# Patient Record
Sex: Female | Born: 1975
Health system: Southern US, Community
[De-identification: ages and names within clinical notes are randomized; demographics above are authoritative.]

## PROBLEM LIST (undated history)

## (undated) DIAGNOSIS — Z9889 Other specified postprocedural states: Secondary | ICD-10-CM

## (undated) DIAGNOSIS — R112 Nausea with vomiting, unspecified: Secondary | ICD-10-CM

## (undated) DIAGNOSIS — Z8632 Personal history of gestational diabetes: Secondary | ICD-10-CM

## (undated) DIAGNOSIS — E039 Hypothyroidism, unspecified: Secondary | ICD-10-CM

## (undated) HISTORY — PX: TONSILLECTOMY: SUR1361

## (undated) HISTORY — DX: Personal history of gestational diabetes: Z86.32

## (undated) HISTORY — PX: AUGMENTATION MAMMAPLASTY: SUR837

## (undated) HISTORY — PX: WISDOM TOOTH EXTRACTION: SHX21

---

## 2000-03-02 ENCOUNTER — Emergency Department (HOSPITAL_COMMUNITY): Admission: EM | Admit: 2000-03-02 | Discharge: 2000-03-02 | Payer: Self-pay | Admitting: Emergency Medicine

## 2000-04-20 ENCOUNTER — Ambulatory Visit (HOSPITAL_COMMUNITY): Admission: RE | Admit: 2000-04-20 | Discharge: 2000-04-20 | Payer: Self-pay | Admitting: Endocrinology

## 2000-04-20 ENCOUNTER — Encounter: Payer: Self-pay | Admitting: Endocrinology

## 2000-05-04 ENCOUNTER — Other Ambulatory Visit: Admission: RE | Admit: 2000-05-04 | Discharge: 2000-05-04 | Payer: Self-pay | Admitting: Endocrinology

## 2000-06-06 ENCOUNTER — Other Ambulatory Visit: Admission: RE | Admit: 2000-06-06 | Discharge: 2000-06-06 | Payer: Self-pay | Admitting: Gynecology

## 2000-12-05 ENCOUNTER — Emergency Department (HOSPITAL_COMMUNITY): Admission: EM | Admit: 2000-12-05 | Discharge: 2000-12-05 | Payer: Self-pay | Admitting: Emergency Medicine

## 2001-01-29 ENCOUNTER — Emergency Department (HOSPITAL_COMMUNITY): Admission: EM | Admit: 2001-01-29 | Discharge: 2001-01-29 | Payer: Self-pay | Admitting: Emergency Medicine

## 2005-10-12 ENCOUNTER — Encounter: Admission: RE | Admit: 2005-10-12 | Discharge: 2005-10-12 | Payer: Self-pay | Admitting: Family Medicine

## 2006-04-18 ENCOUNTER — Emergency Department (HOSPITAL_COMMUNITY): Admission: EM | Admit: 2006-04-18 | Discharge: 2006-04-18 | Payer: Self-pay | Admitting: Emergency Medicine

## 2008-11-08 ENCOUNTER — Ambulatory Visit (HOSPITAL_COMMUNITY): Admission: RE | Admit: 2008-11-08 | Discharge: 2008-11-08 | Payer: Self-pay | Admitting: Obstetrics and Gynecology

## 2008-11-08 ENCOUNTER — Encounter (INDEPENDENT_AMBULATORY_CARE_PROVIDER_SITE_OTHER): Payer: Self-pay | Admitting: Obstetrics and Gynecology

## 2009-05-15 ENCOUNTER — Emergency Department (HOSPITAL_COMMUNITY): Admission: EM | Admit: 2009-05-15 | Discharge: 2009-05-15 | Payer: Self-pay | Admitting: Emergency Medicine

## 2010-06-10 ENCOUNTER — Encounter: Admission: RE | Admit: 2010-06-10 | Discharge: 2010-06-10 | Payer: Self-pay | Admitting: Obstetrics and Gynecology

## 2010-07-25 ENCOUNTER — Inpatient Hospital Stay (HOSPITAL_COMMUNITY): Admission: AD | Admit: 2010-07-25 | Discharge: 2010-07-27 | Payer: Self-pay | Admitting: Obstetrics and Gynecology

## 2011-02-04 LAB — CBC
HCT: 41.1 % (ref 36.0–46.0)
Hemoglobin: 13.9 g/dL (ref 12.0–15.0)
MCH: 30.9 pg (ref 26.0–34.0)
MCH: 31 pg (ref 26.0–34.0)
MCHC: 33.8 g/dL (ref 30.0–36.0)
MCV: 91.3 fL (ref 78.0–100.0)
Platelets: 159 10*3/uL (ref 150–400)
Platelets: 217 10*3/uL (ref 150–400)
RBC: 3.84 MIL/uL — ABNORMAL LOW (ref 3.87–5.11)
RBC: 4.5 MIL/uL (ref 3.87–5.11)
RDW: 14.2 % (ref 11.5–15.5)
WBC: 6.9 10*3/uL (ref 4.0–10.5)
WBC: 8 10*3/uL (ref 4.0–10.5)

## 2011-02-04 LAB — RPR: RPR Ser Ql: NONREACTIVE

## 2011-02-04 LAB — RH IMMUNE GLOB WKUP(>/=20WKS)(NOT WOMEN'S HOSP): Antibody Screen: NEGATIVE

## 2011-03-01 LAB — BASIC METABOLIC PANEL
CO2: 26 mEq/L (ref 19–32)
Calcium: 8.8 mg/dL (ref 8.4–10.5)
Glucose, Bld: 104 mg/dL — ABNORMAL HIGH (ref 70–99)
Sodium: 140 mEq/L (ref 135–145)

## 2011-03-01 LAB — DIFFERENTIAL
Basophils Absolute: 0 10*3/uL (ref 0.0–0.1)
Basophils Relative: 0 % (ref 0–1)
Eosinophils Absolute: 0 10*3/uL (ref 0.0–0.7)
Eosinophils Relative: 0 % (ref 0–5)
Monocytes Absolute: 0.6 10*3/uL (ref 0.1–1.0)
Neutro Abs: 4.4 10*3/uL (ref 1.7–7.7)

## 2011-03-01 LAB — POCT CARDIAC MARKERS
CKMB, poc: <1 ng/mL — ABNORMAL LOW (ref 1.0–8.0)
Myoglobin, poc: 21.8 ng/mL (ref 12–200)
Troponin i, poc: <0.05 ng/mL (ref 0.00–0.09)

## 2011-03-01 LAB — CBC
Hemoglobin: 13.6 g/dL (ref 12.0–15.0)
MCHC: 34.7 g/dL (ref 30.0–36.0)
RDW: 12.8 % (ref 11.5–15.5)

## 2011-04-06 NOTE — Op Note (Signed)
NAME:  Paula Mayer, Paula Mayer                  ACCOUNT NO.:  [id number]   MEDICAL RECORD NO.:  [id number]          PATIENT TYPE:  AMB   LOCATION:  SDC                           FACILITY:  [facility]   PHYSICIAN:  Zenaida Niece, M.D.DATE OF BIRTH:  11-Nov-1976   DATE OF PROCEDURE:  11/08/2008  DATE OF DISCHARGE:                               OPERATIVE REPORT   PREOPERATIVE DIAGNOSIS:  Anembryonic pregnancy versus ectopic pregnancy.   POSTOPERATIVE DIAGNOSIS:  Anembryonic pregnancy versus ectopic  pregnancy.   PROCEDURE:  Dilation and evacuation.   SURGEON:  Zenaida Niece, MD   ANESTHESIA:  Monitored anesthesia care and paracervical block.   FINDINGS:  She had a small uterus and minimal tissue on curettings.   SPECIMENS:  Uterine curettings sent for frozen section and then routine  pathology.  Frozen section results were pending at the time of  dictation.   ESTIMATED BLOOD LOSS:  Minimal.   COMPLICATIONS:  None.   PROCEDURE IN DETAIL:  The patient was taken to the operating room and  placed in the dorsal supine position.  She was given IV sedation and  placed in mobile stirrups.  Legs were then elevated in stirrups.  Perineum and vagina were then prepped and draped in the usual sterile  fashion and bladder drained with a red Robinson catheter.  A Graves  speculum was then inserted into the vagina and the anterior lip of the  cervix was grasped with a single-tooth tenaculum.  Paracervical block  was then performed with a total of 16 mL of 2% plain lidocaine.  Uterus  was then sounded to 8 cm.  Cervix was gradually dilated to a size 23  dilator.  The size 7 curved suction curette was then inserted and  suction curettage was performed with return of a small amount of tissue.  Sharp curettage was then performed with a small curette and this  revealed good uterine cry in all quadrants and no significant tissue.  Suction curettage was performed one more time with return of minimal  blood.   All instruments were then removed from the cervix.  The single-  tooth tenaculum was removed and bleeding was controlled with pressure.  The Graves speculum was then removed.  The patient was taken down from  stirrups.  She was taken to the recovery room in stable  condition after tolerating the procedure well.  Counts were correct.  I  will await the results of her frozen section.  If there are chorionic  villi, she will be sent home.  If there are no villi, she will receive  methotrexate for a possible ectopic pregnancy.  She has previously  received RhoGAM.      Zenaida Niece, M.D.  Electronically Signed     TDM/MEDQ  D:  11/08/2008  T:  11/09/2008  Job:  [id number]

## 2011-05-02 ENCOUNTER — Ambulatory Visit (INDEPENDENT_AMBULATORY_CARE_PROVIDER_SITE_OTHER): Payer: PRIVATE HEALTH INSURANCE

## 2011-05-02 ENCOUNTER — Inpatient Hospital Stay (INDEPENDENT_AMBULATORY_CARE_PROVIDER_SITE_OTHER)
Admission: RE | Admit: 2011-05-02 | Discharge: 2011-05-02 | Disposition: A | Discharge status: Home / Self Care | Payer: PRIVATE HEALTH INSURANCE | Source: Ambulatory Visit | Attending: Family Medicine | Admitting: Family Medicine

## 2011-05-02 ENCOUNTER — Ambulatory Visit (HOSPITAL_COMMUNITY): Payer: PRIVATE HEALTH INSURANCE

## 2011-05-02 DIAGNOSIS — S5010XA Contusion of unspecified forearm, initial encounter: Secondary | ICD-10-CM

## 2011-08-27 LAB — ABO/RH: ABO/RH(D): O NEG

## 2011-08-27 LAB — CBC
RBC: 4.59 MIL/uL (ref 3.87–5.11)
WBC: 6.9 10*3/uL (ref 4.0–10.5)

## 2011-08-27 LAB — CREATININE, SERUM
Creatinine, Ser: 0.64 mg/dL (ref 0.4–1.2)
GFR calc Af Amer: >60 mL/min (ref >60)

## 2011-08-27 LAB — AST: AST: 31 U/L (ref 0–37)

## 2013-06-04 ENCOUNTER — Other Ambulatory Visit: Payer: Self-pay | Admitting: Endocrinology

## 2013-06-04 DIAGNOSIS — E039 Hypothyroidism, unspecified: Secondary | ICD-10-CM

## 2013-06-05 ENCOUNTER — Other Ambulatory Visit (INDEPENDENT_AMBULATORY_CARE_PROVIDER_SITE_OTHER): Payer: 59

## 2013-06-05 ENCOUNTER — Other Ambulatory Visit: Payer: PRIVATE HEALTH INSURANCE

## 2013-06-05 DIAGNOSIS — E039 Hypothyroidism, unspecified: Secondary | ICD-10-CM

## 2013-06-07 ENCOUNTER — Encounter: Payer: Self-pay | Admitting: Endocrinology

## 2013-06-07 ENCOUNTER — Ambulatory Visit (INDEPENDENT_AMBULATORY_CARE_PROVIDER_SITE_OTHER): Payer: 59 | Admitting: Endocrinology

## 2013-06-07 VITALS — BP 120/60 | HR 78 | Resp 12 | Ht 61.0 in | Wt 122.5 lb

## 2013-06-07 DIAGNOSIS — E890 Postprocedural hypothyroidism: Secondary | ICD-10-CM | POA: Insufficient documentation

## 2013-06-07 DIAGNOSIS — E039 Hypothyroidism, unspecified: Secondary | ICD-10-CM

## 2013-06-07 DIAGNOSIS — E041 Nontoxic single thyroid nodule: Secondary | ICD-10-CM

## 2013-06-07 NOTE — Progress Notes (Signed)
Patient ID: Paula Mayer, female   DOB: 1976-02-20, 37 y.o.   MRN: [id number]  Reason for Appointment:  Hypothyroidism, followup visit    History of Present Illness:   The hypothyroidism was first diagnosed  several years ago in conjunction with her thyroid nodule Historically she had been on very high doses of thyroid supplements when she was very young but this dose was reduced to a few years ago She has been on a stable dose of 50 mcg Synthroid for the last 2-3 visits with normal thyroid functions.  She was tried off thyroid supplements at some point  but TSH had increased and she had more fatigue  Complaints are reported by the patient now are none, she does have some tiredness which is mild and not new. Also she thinks her hair is more coarse          The symptoms consistent with hypothyroidism are: fatigue mild  can't seem to lose weight , cold sensitivity, difficulty concentrating , dry skin, hair loss.                 Compliance with the medical regimen has been as prescribed with taking the tablet in the morning before breakfast.  She has had previous evaluation for her thyroid NODULE and reportedly has had a benign nodule, records currently not available  Appointment on 06/05/2013  Component Date Value Range Status  . TSH 06/05/2013 1.88  0.35 - 5.50 uIU/mL Final  . Free T4 06/05/2013 1.07  0.60 - 1.60 ng/dL Final      Medication List       This list is accurate as of: 06/07/13 10:15 AM.  Always use your most recent med list.               levothyroxine 50 MCG tablet  Commonly known as:  SYNTHROID, LEVOTHROID  Take 50 mcg by mouth daily before breakfast.         No past medical history on file.  No past surgical history on file.  No family history on file.  Social History:  reports that she has been smoking Cigarettes.  She has been smoking about 0.00 packs per day. She does not have any smokeless tobacco history on file. Her alcohol and drug  histories are not on file.  Allergies: No Known Allergies   Examination:   BP 120/60  Pulse 78  Resp 12  Ht 5\' 1"  (1.549 m)  Wt 122 lb 8 oz (55.566 kg)  BMI 23.16 kg/m2  SpO2 98%  LMP 05/28/2013   GENERAL APPEARANCE: Alert And looks well.    FACE: No puffiness of face or periorbital edema.         NECK:  she has a slightly firm smooth thyroid nodule extending from midline to the right about 3.5 cm in size. Left lobe was not palpable and there is no neck lymphadenopathy          NEUROLOGIC EXAM: DTRs 1+ bilaterally at biceps.    Assessments/PLAN:   1. Hypothyroidism - 244.9 , this in mild and she was reminded of the need for taking thyroid supplements lifelong. TSH is in an excellent range currently and she can followup annually  2. Long-standing thyroid nodule, appears to be stable in size and will be followed clinically, records from previous practice to be reviewed when available   [facility] 06/07/2013, 10:15 AM

## 2013-06-07 NOTE — Patient Instructions (Addendum)
Same dose 

## 2013-06-11 ENCOUNTER — Other Ambulatory Visit: Payer: Self-pay | Admitting: *Deleted

## 2013-06-11 MED ORDER — LEVOTHYROXINE SODIUM 50 MCG PO TABS: 50.0000 ug | ORAL_TABLET | Freq: Every day | ORAL | Status: DC

## 2013-06-12 ENCOUNTER — Other Ambulatory Visit: Payer: Self-pay | Admitting: *Deleted

## 2013-08-08 ENCOUNTER — Other Ambulatory Visit: Payer: Self-pay | Admitting: Endocrinology

## 2013-11-22 HISTORY — PX: BREAST ENHANCEMENT SURGERY: SHX7

## 2013-11-26 ENCOUNTER — Telehealth: Payer: Self-pay | Admitting: *Deleted

## 2013-11-26 NOTE — Telephone Encounter (Signed)
Noted patient is aware 

## 2013-11-26 NOTE — Telephone Encounter (Signed)
Pt wants to know if you will call her in an rx for Tami-flu, she said she just left her sons pediatricians office and he was diagnosed with the flu, she states she is not having any symptoms but wants to do the preventative treatment.  Please advise

## 2013-11-26 NOTE — Telephone Encounter (Signed)
She needs to discuss with PCP, we do not treat for influenza

## 2013-11-30 ENCOUNTER — Other Ambulatory Visit: Payer: 59

## 2013-12-07 ENCOUNTER — Ambulatory Visit: Payer: 59 | Admitting: Endocrinology

## 2013-12-28 ENCOUNTER — Other Ambulatory Visit (INDEPENDENT_AMBULATORY_CARE_PROVIDER_SITE_OTHER): Payer: 59

## 2013-12-28 DIAGNOSIS — E039 Hypothyroidism, unspecified: Secondary | ICD-10-CM

## 2013-12-28 LAB — T4, FREE: FREE T4: 1.06 ng/dL (ref 0.60–1.60)

## 2013-12-28 LAB — TSH: TSH: 1.1 u[IU]/mL (ref 0.35–5.50)

## 2013-12-31 ENCOUNTER — Encounter: Payer: Self-pay | Admitting: Endocrinology

## 2013-12-31 ENCOUNTER — Ambulatory Visit (INDEPENDENT_AMBULATORY_CARE_PROVIDER_SITE_OTHER): Payer: 59 | Admitting: Endocrinology

## 2013-12-31 VITALS — BP 110/62 | HR 98 | Temp 98.3°F | Resp 14 | Ht 61.0 in | Wt 123.2 lb

## 2013-12-31 DIAGNOSIS — E039 Hypothyroidism, unspecified: Secondary | ICD-10-CM

## 2013-12-31 DIAGNOSIS — E041 Nontoxic single thyroid nodule: Secondary | ICD-10-CM

## 2013-12-31 MED ORDER — SYNTHROID 50 MCG PO TABS: 50.0000 ug | ORAL_TABLET | Freq: Every day | ORAL | Status: DC

## 2013-12-31 NOTE — Progress Notes (Signed)
Patient ID: Paula Mayer, female   DOB: 1976-05-06, 38 y.o.   MRN: [id number]  Reason for Appointment:  Hypothyroidism, followup visit    History of Present Illness:   The hypothyroidism was first diagnosed  in 1994, following I-131 treatment for a hot nodules associated with subclinical hyperthyroidism. Initially TSH was 10.1 Initially for a few years she had been on very high doses of thyroid supplement but her dose was progressively reduced a few years ago She has been on a stable dose of 50 mcg Synthroid since about 2011 She was tried off Synthroid because of negative TPO antibodies  but TSH  increased to 8.8 in 08/2012 and she had more fatigue  Complaints are reported by the patient now are none, she does not have any unusual fatigue, cold intolerance or hair loss.  She had lost weight previously on her own because of prior history of gestational diabetes and has been able to maintain this now           Compliance with the medical regimen has been as prescribed with taking the tablet in the morning before breakfast. She says she feels better with the brand name Synthroid  She has had previous needle aspirations for her partially cystic thyroid NODULE and this was benign. Do not think she has noticed any recent change in size or at local discomfort  Appointment on 12/28/2013  Component Date Value Range Status  . Free T4 12/28/2013 1.06  0.60 - 1.60 ng/dL Final  . TSH 30/86/5784 1.10  0.35 - 5.50 uIU/mL Final      Medication List       This list is accurate as of: 12/31/13  8:47 AM.  Always use your most recent med list.               SYNTHROID 50 MCG tablet  Generic drug:  levothyroxine  Take 1 tablet (50 mcg total) by mouth daily before breakfast.         Past Medical History  Diagnosis Date  . H/O gestational diabetes mellitus, not currently pregnant     No past surgical history on file.  Family History  Problem Relation Age of Onset  . Diabetes Paternal  Grandmother     Social History:  reports that she has been smoking Cigarettes.  She has been smoking about 0.00 packs per day. She does not have any smokeless tobacco history on file. Her alcohol and drug histories are not on file.  Allergies: No Known Allergies   Examination:   BP 110/62  Pulse 98  Temp(Src) 98.3 F (36.8 C)  Resp 14  Ht 5\' 1"  (1.549 m)  Wt 123 lb 3.2 oz (55.883 kg)  BMI 23.29 kg/m2  SpO2 97%  LMP 12/17/2013   GENERAL APPEARANCE:  she looks well, no dry skin     FACE: No puffiness of face or hands        NECK:  she has a relatively firm smooth thyroid nodule extending from midline to the right about 3.0 cm in size. Left lobe is not palpable and there is no neck lymphadenopathy            Assessments/PLAN:   1. Hypothyroidism - 244.9 , this is again mild and stable. Subjectively she is doing well. She is aware  of the need for taking thyroid supplements lifelong.  TSH is in an excellent range currently and she can followup annually  2. Long-standing thyroid nodule, appears to be stable in size  and will be followed clinically   Wandra Babin 12/31/2013, 8:47 AM

## 2014-02-07 ENCOUNTER — Telehealth: Payer: Self-pay | Admitting: Endocrinology

## 2014-02-07 NOTE — Telephone Encounter (Signed)
See below and advise please.  

## 2014-02-07 NOTE — Telephone Encounter (Signed)
Pt has questions regarding cholesterol level can we order a lab test for her

## 2014-02-07 NOTE — Telephone Encounter (Signed)
Fasting lipid panel, if abnormal may need to discuss in the office with her, currently scheduled for next February

## 2014-11-21 ENCOUNTER — Other Ambulatory Visit: Payer: Self-pay | Admitting: Obstetrics and Gynecology

## 2014-11-21 DIAGNOSIS — R928 Other abnormal and inconclusive findings on diagnostic imaging of breast: Secondary | ICD-10-CM

## 2014-11-22 HISTORY — PX: BREAST BIOPSY: SHX20

## 2014-12-10 ENCOUNTER — Other Ambulatory Visit: Payer: Self-pay | Admitting: Obstetrics and Gynecology

## 2014-12-10 ENCOUNTER — Ambulatory Visit
Admission: RE | Admit: 2014-12-10 | Discharge: 2014-12-10 | Disposition: A | Discharge status: Home / Self Care | Payer: 59 | Source: Ambulatory Visit | Attending: Obstetrics and Gynecology | Admitting: Obstetrics and Gynecology

## 2014-12-10 DIAGNOSIS — N6320 Unspecified lump in the left breast, unspecified quadrant: Secondary | ICD-10-CM

## 2014-12-10 DIAGNOSIS — R928 Other abnormal and inconclusive findings on diagnostic imaging of breast: Secondary | ICD-10-CM

## 2014-12-12 ENCOUNTER — Other Ambulatory Visit: Payer: Self-pay | Admitting: Obstetrics and Gynecology

## 2014-12-12 DIAGNOSIS — N6320 Unspecified lump in the left breast, unspecified quadrant: Secondary | ICD-10-CM

## 2014-12-17 ENCOUNTER — Inpatient Hospital Stay: Admission: RE | Admit: 2014-12-17 | Payer: 59 | Source: Ambulatory Visit

## 2014-12-17 ENCOUNTER — Ambulatory Visit
Admission: RE | Admit: 2014-12-17 | Discharge: 2014-12-17 | Disposition: A | Discharge status: Home / Self Care | Payer: 59 | Source: Ambulatory Visit | Attending: Obstetrics and Gynecology | Admitting: Obstetrics and Gynecology

## 2014-12-17 DIAGNOSIS — N6320 Unspecified lump in the left breast, unspecified quadrant: Secondary | ICD-10-CM

## 2014-12-29 ENCOUNTER — Other Ambulatory Visit: Payer: Self-pay | Admitting: Endocrinology

## 2014-12-31 ENCOUNTER — Other Ambulatory Visit (INDEPENDENT_AMBULATORY_CARE_PROVIDER_SITE_OTHER): Payer: 59

## 2014-12-31 DIAGNOSIS — E039 Hypothyroidism, unspecified: Secondary | ICD-10-CM

## 2014-12-31 LAB — TSH: TSH: 0.43 u[IU]/mL (ref 0.35–4.50)

## 2014-12-31 LAB — T4, FREE: Free T4: 1.01 ng/dL (ref 0.60–1.60)

## 2015-01-03 ENCOUNTER — Ambulatory Visit (INDEPENDENT_AMBULATORY_CARE_PROVIDER_SITE_OTHER): Payer: 59 | Admitting: Endocrinology

## 2015-01-03 ENCOUNTER — Encounter: Payer: Self-pay | Admitting: Endocrinology

## 2015-01-03 VITALS — BP 107/69 | HR 79 | Temp 97.8°F | Resp 14 | Ht 61.0 in | Wt 129.6 lb

## 2015-01-03 DIAGNOSIS — E785 Hyperlipidemia, unspecified: Secondary | ICD-10-CM

## 2015-01-03 DIAGNOSIS — E041 Nontoxic single thyroid nodule: Secondary | ICD-10-CM

## 2015-01-03 NOTE — Progress Notes (Signed)
Patient ID: Paula Mayer, female   DOB: [birth year], 39 y.o.   MRN: [id number]  Reason for Appointment:  Hypothyroidism, followup visit    History of Present Illness:   The hypothyroidism was first diagnosed  in 1994, following I-131 treatment for a hot nodules associated with subclinical hyperthyroidism.  Initially TSH was 10.1 Initially for a few years she had been on very high doses of thyroid supplement but her dose was progressively reduced a few years ago She has been on a stable dose of 50 mcg Synthroid since about 2011 She was taken off Synthroid because of negative TPO antibodies  but TSH  increased to 8.8 in 08/2012 and she had more fatigue  Complaints are reported by the patient now are none, she does not have any unusual fatigue, cold intolerance or hair loss.  She had lost weight previously on her own because of prior history of gestational diabetes and has been able to maintain this reasonably well  Wt Readings from Last 3 Encounters:  01/03/15 129 lb 9.6 oz (58.786 kg)  12/31/13 123 lb 3.2 oz (55.883 kg)  06/07/13 122 lb 8 oz (55.566 kg)     THYROID nodule: She has had previous needle aspirations for her partially cystic thyroid NODULE and this was benign.  She does not think she has noticed any recent change in size or at local discomfort  Compliance with the thyroid medication has been as prescribed with taking the tablet in the morning before breakfast.  She says she feels better with the brand name Synthroid and her TSH again is fairly good    Lab Results  Component Value Date   TSH 0.43 12/31/2014   TSH 1.10 12/28/2013   TSH 1.88 06/05/2013   FREET4 1.01 12/31/2014   FREET4 1.06 12/28/2013   FREET4 1.07 06/05/2013    Appointment on 12/31/2014  Component Date Value Ref Range Status  . TSH 12/31/2014 0.43  0.35 - 4.50 uIU/mL Final  . Free T4 12/31/2014 1.01  0.60 - 1.60 ng/dL Final      Medication List       This list is accurate as of:  01/03/15  8:10 AM.  Always use your most recent med list.               SYNTHROID 50 MCG tablet  Generic drug:  levothyroxine  TAKE 1 TABLET BY MOUTH EVERY DAY BEFORE BREAKFAST         Past Medical History  Diagnosis Date  . H/O gestational diabetes mellitus, not currently pregnant     No past surgical history on file.  Family History  Problem Relation Age of Onset  . Diabetes Paternal Grandmother     Social History:  reports that she has been smoking Cigarettes.  She does not have any smokeless tobacco history on file. Her alcohol and drug histories are not on file.  Allergies: No Known Allergies   ROS:  She thinks she has had hypercholesterol in the past but has not followed up with any primary care physician. She wants this rechecked on her next visit    Examination:   BP 107/69 mmHg  Pulse 79  Temp(Src) 97.8 F (36.6 C)  Resp 14  Ht  (1.549 m)  Wt 129 lb 9.6 oz (58.786 kg)  BMI 24.50 kg/m2  SpO2 96%  She looks well    FACE: No puffiness of face or hands Skin is slightly dry.         NECK:  she has a somewhat firm smooth thyroid nodule mostly in the isthmus and extending to the right about 2.8-3.0 cm in size.  Left lobe is not palpable and there is no neck lymphadenopathy            Assessments/PLAN:   1. Hypothyroidism post ablative , this is again mild and stable. Subjectively she is feeling quite well Not clear why her TSH is low normal at 0.43 but she is asymptomatic. Since he became hypothyroid previously with a trial off the supplement will not do this as yet However would like to see her back in follow-up in 6 months to make sure she is not developing subclinical hyperthyroidism She takes her Synthroid fairly consistently  2. Long-standing thyroid nodule, appears to be stable in size and will be followed clinically   Tyffani Foglesong 01/03/2015, 8:10 AM

## 2015-01-31 ENCOUNTER — Other Ambulatory Visit: Payer: Self-pay | Admitting: Endocrinology

## 2015-06-25 ENCOUNTER — Encounter: Payer: Self-pay | Admitting: Endocrinology

## 2015-07-01 ENCOUNTER — Other Ambulatory Visit: Payer: 59

## 2015-07-04 ENCOUNTER — Ambulatory Visit: Payer: 59 | Admitting: Endocrinology

## 2015-07-21 ENCOUNTER — Other Ambulatory Visit: Payer: Self-pay | Admitting: *Deleted

## 2015-07-21 ENCOUNTER — Other Ambulatory Visit (INDEPENDENT_AMBULATORY_CARE_PROVIDER_SITE_OTHER): Payer: 59

## 2015-07-21 DIAGNOSIS — E041 Nontoxic single thyroid nodule: Secondary | ICD-10-CM | POA: Diagnosis not present

## 2015-07-21 DIAGNOSIS — E038 Other specified hypothyroidism: Secondary | ICD-10-CM

## 2015-07-21 LAB — LIPID PANEL
Cholesterol: 158 mg/dL (ref 0–200)
HDL: 40.2 mg/dL (ref >39.00)
LDL Cholesterol: 104 mg/dL — ABNORMAL HIGH (ref 0–99)
NonHDL: 117.31
TRIGLYCERIDES: 67 mg/dL (ref 0.0–149.0)
Total CHOL/HDL Ratio: 4
VLDL: 13.4 mg/dL (ref 0.0–40.0)

## 2015-07-21 LAB — TSH: TSH: 0.58 u[IU]/mL (ref 0.35–4.50)

## 2015-07-21 LAB — T4, FREE: Free T4: 1.13 ng/dL (ref 0.60–1.60)

## 2015-07-23 ENCOUNTER — Ambulatory Visit (INDEPENDENT_AMBULATORY_CARE_PROVIDER_SITE_OTHER): Payer: 59 | Admitting: Endocrinology

## 2015-07-23 ENCOUNTER — Encounter: Payer: Self-pay | Admitting: Endocrinology

## 2015-07-23 VITALS — BP 110/72 | HR 95 | Temp 97.6°F | Resp 14 | Ht 61.0 in | Wt 130.2 lb

## 2015-07-23 DIAGNOSIS — E041 Nontoxic single thyroid nodule: Secondary | ICD-10-CM

## 2015-07-23 DIAGNOSIS — E038 Other specified hypothyroidism: Secondary | ICD-10-CM

## 2015-07-23 MED ORDER — SYNTHROID 50 MCG PO TABS: 50.0000 ug | ORAL_TABLET | Freq: Every day | ORAL | Status: DC

## 2015-07-23 NOTE — Progress Notes (Signed)
Patient ID: Paula Mayer, female   DOB: [birth year], 39 y.o.   MRN: [id number]  Reason for Appointment:  Hypothyroidism, followup visit    History of Present Illness:   The hypothyroidism was first diagnosed  in 1994, following I-131 treatment for a hot nodules associated with subclinical hyperthyroidism.  Initially TSH was 10.1 Initially for a few years she had been on very high doses of thyroid supplement but her dose was progressively reduced a few years ago She has been on a stable dose of 50 mcg Synthroid since about 2011 She was taken off Synthroid because of negative TPO antibodies  but TSH  increased to 8.8 in 08/2012 and she had more fatigue  She has been consistently on 50 g of Synthroid since then.  She does prefer the brand name medication  Complaints are reported by the patient now are feeling somewhat jittery and unable to wind down in the evenings although she did not feel excessively anxious or have any difficulty sleeping No shakiness or heat intolerance  Compliance with the thyroid medication has been as prescribed with taking the tablet in the morning before breakfast.    Wt Readings from Last 3 Encounters:  07/23/15 130 lb 3.2 oz (59.058 kg)  01/03/15 129 lb 9.6 oz (58.786 kg)  12/31/13 123 lb 3.2 oz (55.883 kg)    Her thyroid levels have been stable:  Lab Results  Component Value Date   TSH 0.58 07/21/2015   TSH 0.43 12/31/2014   TSH 1.10 12/28/2013   FREET4 1.13 07/21/2015   FREET4 1.01 12/31/2014   FREET4 1.06 12/28/2013      THYROID nodule: She has had previous needle aspirations for her partially cystic thyroid NODULE several years ago  and this was benign.  She does not think she has noticed any recent change in size     Appointment on 07/21/2015  Component Date Value Ref Range Status  . TSH 07/21/2015 0.58  0.35 - 4.50 uIU/mL Final  . Free T4 07/21/2015 1.13  0.60 - 1.60 ng/dL Final  . Cholesterol 40/98/1191 158  0 - 200  mg/dL Final   ATP III Classification       Desirable:  < 200 mg/dL               Borderline High:  200 - 239 mg/dL          High:  > = 478 mg/dL  . Triglycerides 07/21/2015 67.0  0.0 - 149.0 mg/dL Final   Normal:  <295 mg/dLBorderline High:  150 - 199 mg/dL  . HDL 07/21/2015 40.20  >39.00 mg/dL Final  . VLDL 62/13/0865 13.4  0.0 - 40.0 mg/dL Final  . LDL Cholesterol 07/21/2015 104* 0 - 99 mg/dL Final  . Total CHOL/HDL Ratio 07/21/2015 4   Final                  Men          Women1/2 Average Risk     3.4          3.3Average Risk          5.0          4.42X Average Risk          9.6          7.13X Average Risk          15.0          11.0                      .  NonHDL 07/21/2015 117.31   Final   NOTE:  Non-HDL goal should be 30 mg/dL higher than patient's LDL goal (i.e. LDL goal of < 70 mg/dL, would have non-HDL goal of < 100 mg/dL)      Medication List       This list is accurate as of: 07/23/15  3:47 PM.  Always use your most recent med list.               ibuprofen 200 MG tablet  Commonly known as:  ADVIL,MOTRIN  Take 200 mg by mouth every 6 (six) hours as needed.     SYNTHROID 50 MCG tablet  Generic drug:  levothyroxine  Take 1 tablet (50 mcg total) by mouth daily before breakfast.         Past Medical History  Diagnosis Date  . H/O gestational diabetes mellitus, not currently pregnant     No past surgical history on file.  Family History  Problem Relation Age of Onset  . Diabetes Paternal Grandmother     Social History:  reports that she has been smoking Cigarettes.  She does not have any smokeless tobacco history on file. Her alcohol and drug histories are not on file.  Allergies: No Known Allergies   ROS:  She was previously told to have hypercholesterolemia but her levels are fairly good now  She still does not have a PCP   Her weight has been stable   She had lost weight previously on her own because of prior history of gestational diabetes and has been  able to maintain this reasonably well   Examination:   BP 110/72 mmHg  Pulse 95  Temp(Src) 97.6 F (36.4 C)  Resp 14  Ht 5\' 1"  (1.549 m)  Wt 130 lb 3.2 oz (59.058 kg)  BMI 24.61 kg/m2  SpO2 97%  She looks well    eyes look normal externally         NECK:  she has a slightly firm,  smooth thyroid nodule mostly in the isthmus and extending to the right, measuring about 2.5-3 cm  Left lobe is not palpable and there is no neck lymphadenopathy           Assessments/PLAN:   1. Hypothyroidism post ablative , this is again mild and stable.  Subjectively she is feeling quite well   Recently she is reporting some feeling of hyperstimulation although not excessively anxious Discussed that this is unlikely to be related to her thyroid and may be more because of her change in job and stress Since her TSH stays well below 1.0 will have her take 6-1/2 tablets a week of the Synthroid, new prescription sent  2. Long-standing thyroid nodule, appears to be stable in size and will be followed clinically  Follow-up in one year  [facility] 07/23/2015, 3:47 PM

## 2015-07-31 ENCOUNTER — Other Ambulatory Visit: Payer: Self-pay | Admitting: Endocrinology

## 2015-08-05 IMAGING — US US BREAST*L* LIMITED INC AXILLA
1 series · 8 of 8 positions shown · non-contrast
Comparison: 11/13/2014

CLINICAL DATA: Patient returns after screening study for evaluation
of possible left breast mass. After the patient was contacted
regarding the call back she has been able to palpate a new
abnormality in the left breast. This is marked for this additional
views today.

EXAM:
DIGITAL DIAGNOSTIC  LEFT MAMMOGRAM
ULTRASOUND LEFT BREAST

[Series 1: us breast*left* limited inc axilla · 8 of 8 slices shown]
[im 1/8]
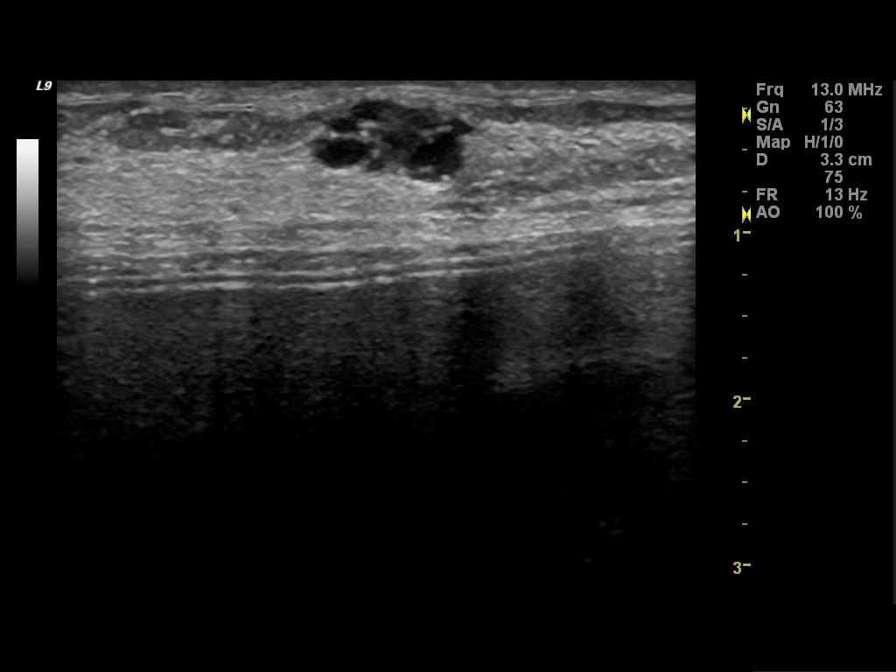
[im 2/8]
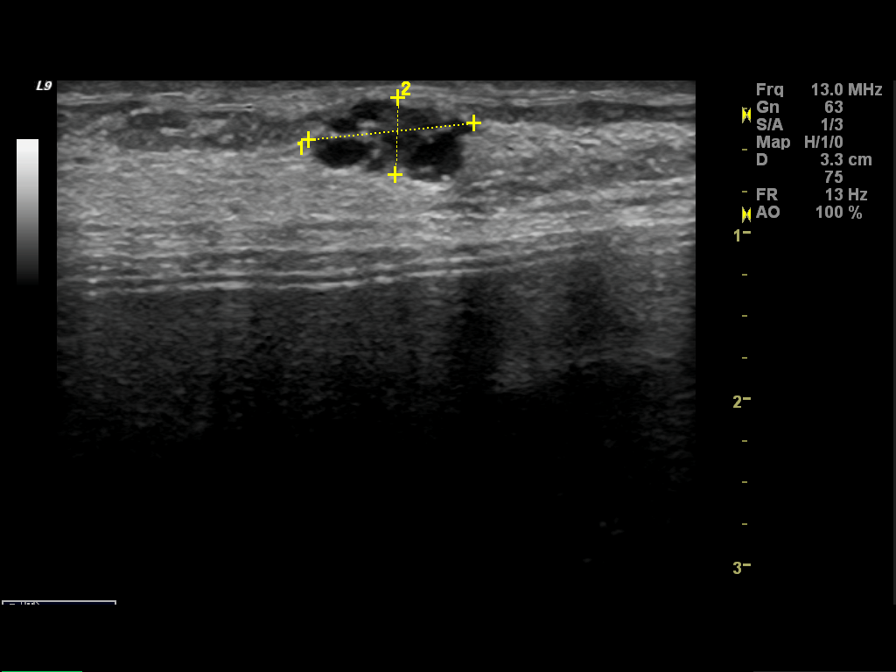
[im 3/8]
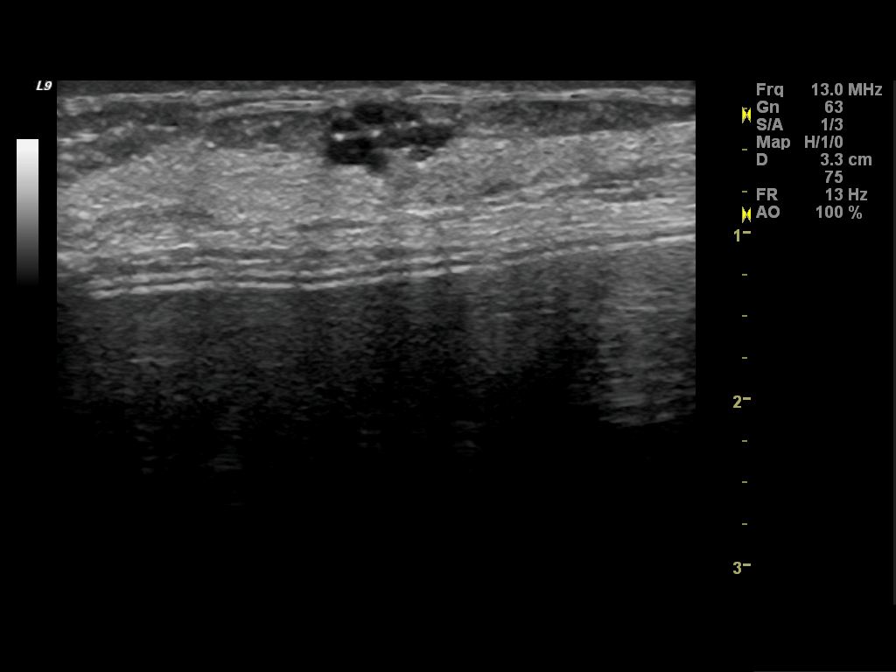
[im 4/8]
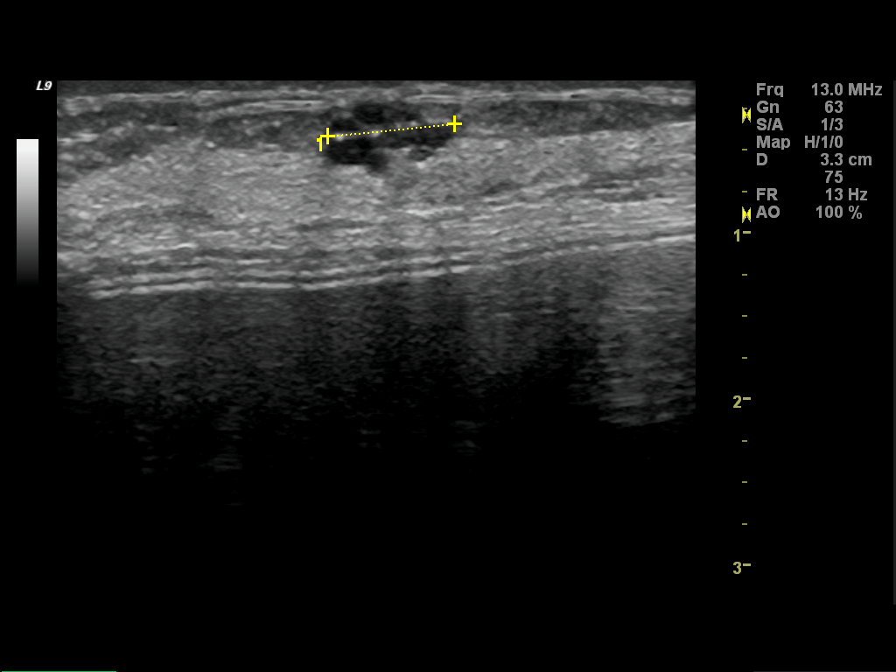
[im 5/8]
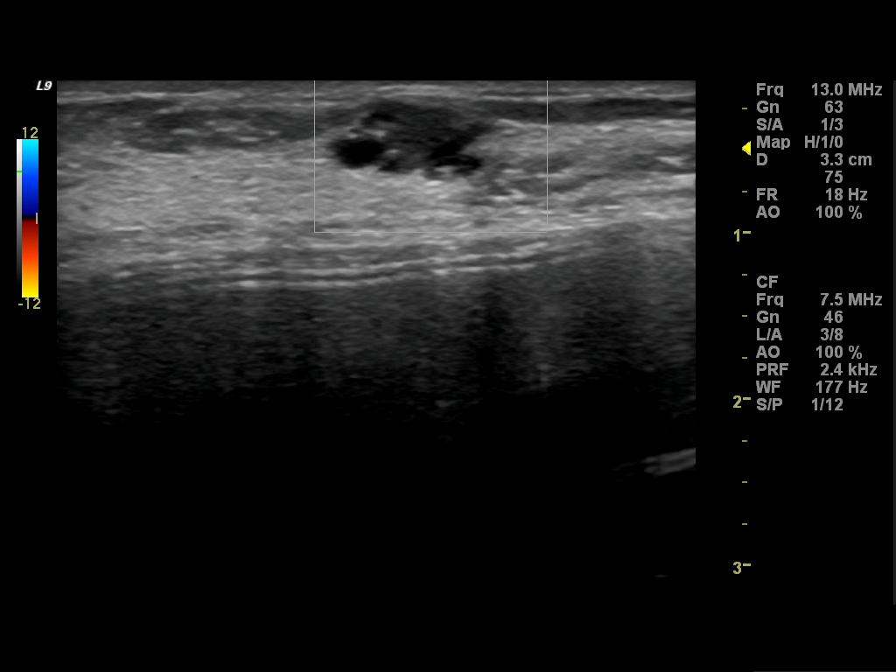
[im 6/8]
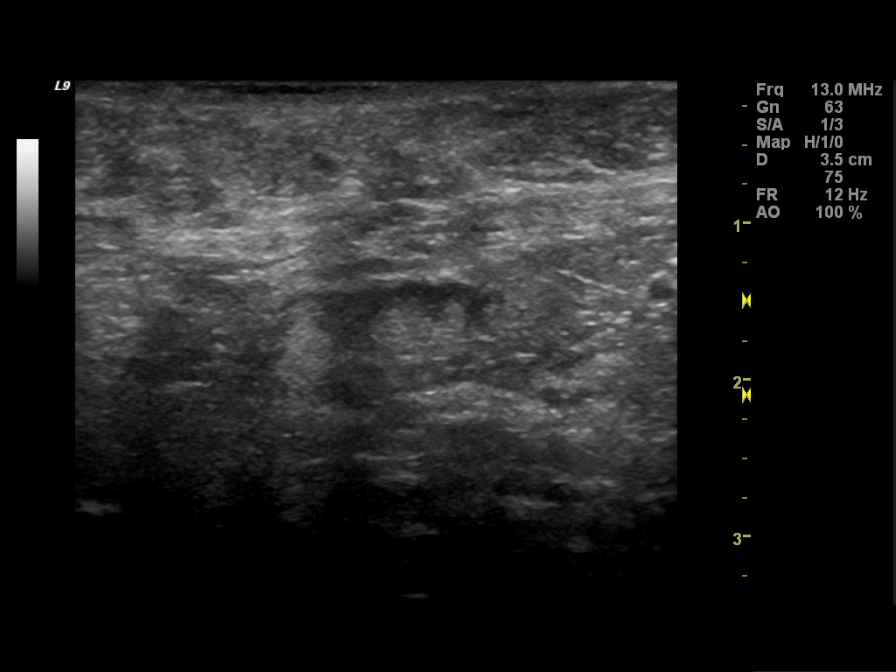
[im 7/8]
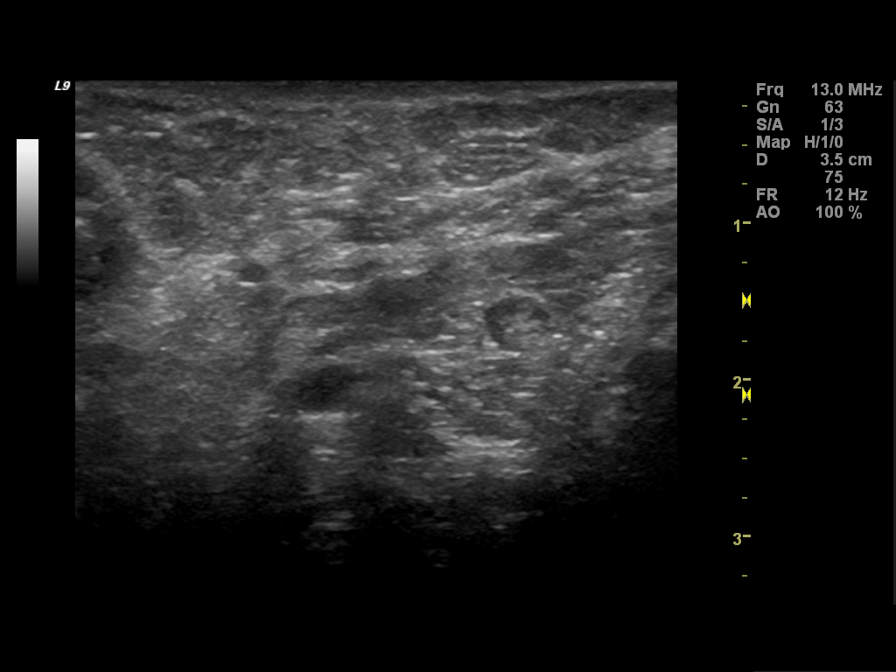
[im 8/8]
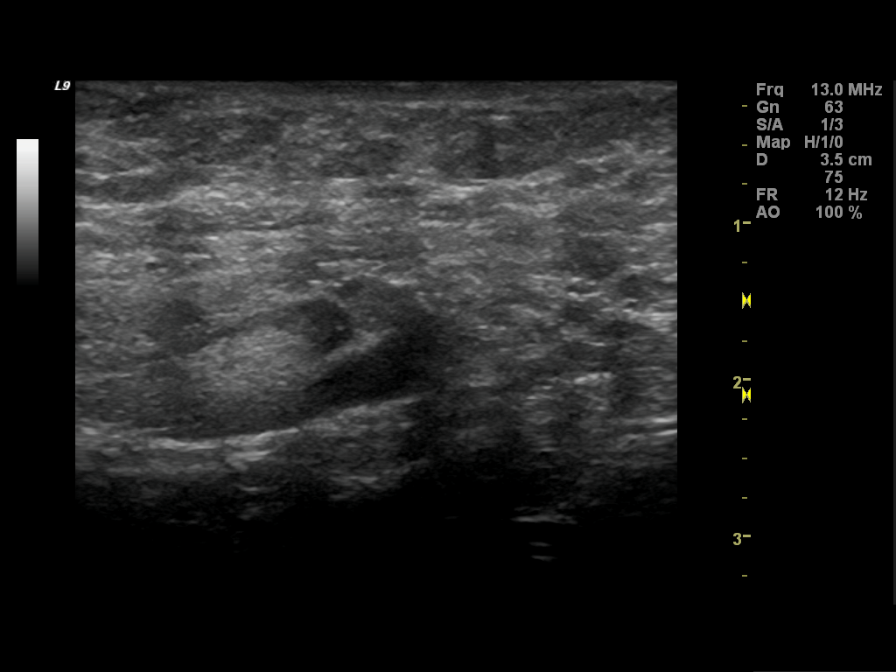

[8 of 8 positions shown; findings below may reference images not displayed]

ACR Breast Density Category b: There are scattered areas of
fibroglandular density.
FINDINGS: Spot compression views confirm presence of a mass within the
upper-outer quadrant of the left breast, also marked with a BB as
palpable. Mass appears irregular and ill defined.

On physical exam, I palpate a discrete mobile superficial nodule in
the 2 o'clock location of the left breast 3 cm from the nipple.

Ultrasound is performed, showing a hypoechoic and anechoic nodule in
the 2 o'clock location of the left breast 3 cm from the nipple
corresponding to the palpable abnormality. Considerations include
apocrine metaplasia or ductal carcinoma in situ. There is no
internal vascularity on Doppler evaluation. Evaluation of the left
axilla is negative for adenopathy.
IMPRESSION: 1. Newly palpable mass irregular in the 2 o'clock location of the
left breast 3 cm from the nipple.
2. Tissue diagnosis is recommended.

RECOMMENDATION:
Ultrasound-guided core biopsy is recommended. We discussed the
procedure for ultrasound-guided core biopsy including the small risk
of implant rupture. Biopsy has been scheduled for the patient on
12/17/2014 at 1 o'clock p.m..

I have discussed the findings and recommendations with the patient.
Results were also provided in writing at the conclusion of the
visit. If applicable, a reminder letter will be sent to the patient
regarding the next appointment.

BI-RADS CATEGORY  4: Suspicious.

## 2015-08-05 IMAGING — MG MM DIAG BREAST W/IMPLANT UNI LEFT
2 series · 2 of 2 positions shown · non-contrast
Comparison: 11/13/2014

CLINICAL DATA: Patient returns after screening study for evaluation
of possible left breast mass. After the patient was contacted
regarding the call back she has been able to palpate a new
abnormality in the left breast. This is marked for this additional
views today.

EXAM:
DIGITAL DIAGNOSTIC  LEFT MAMMOGRAM
ULTRASOUND LEFT BREAST

[L CC]
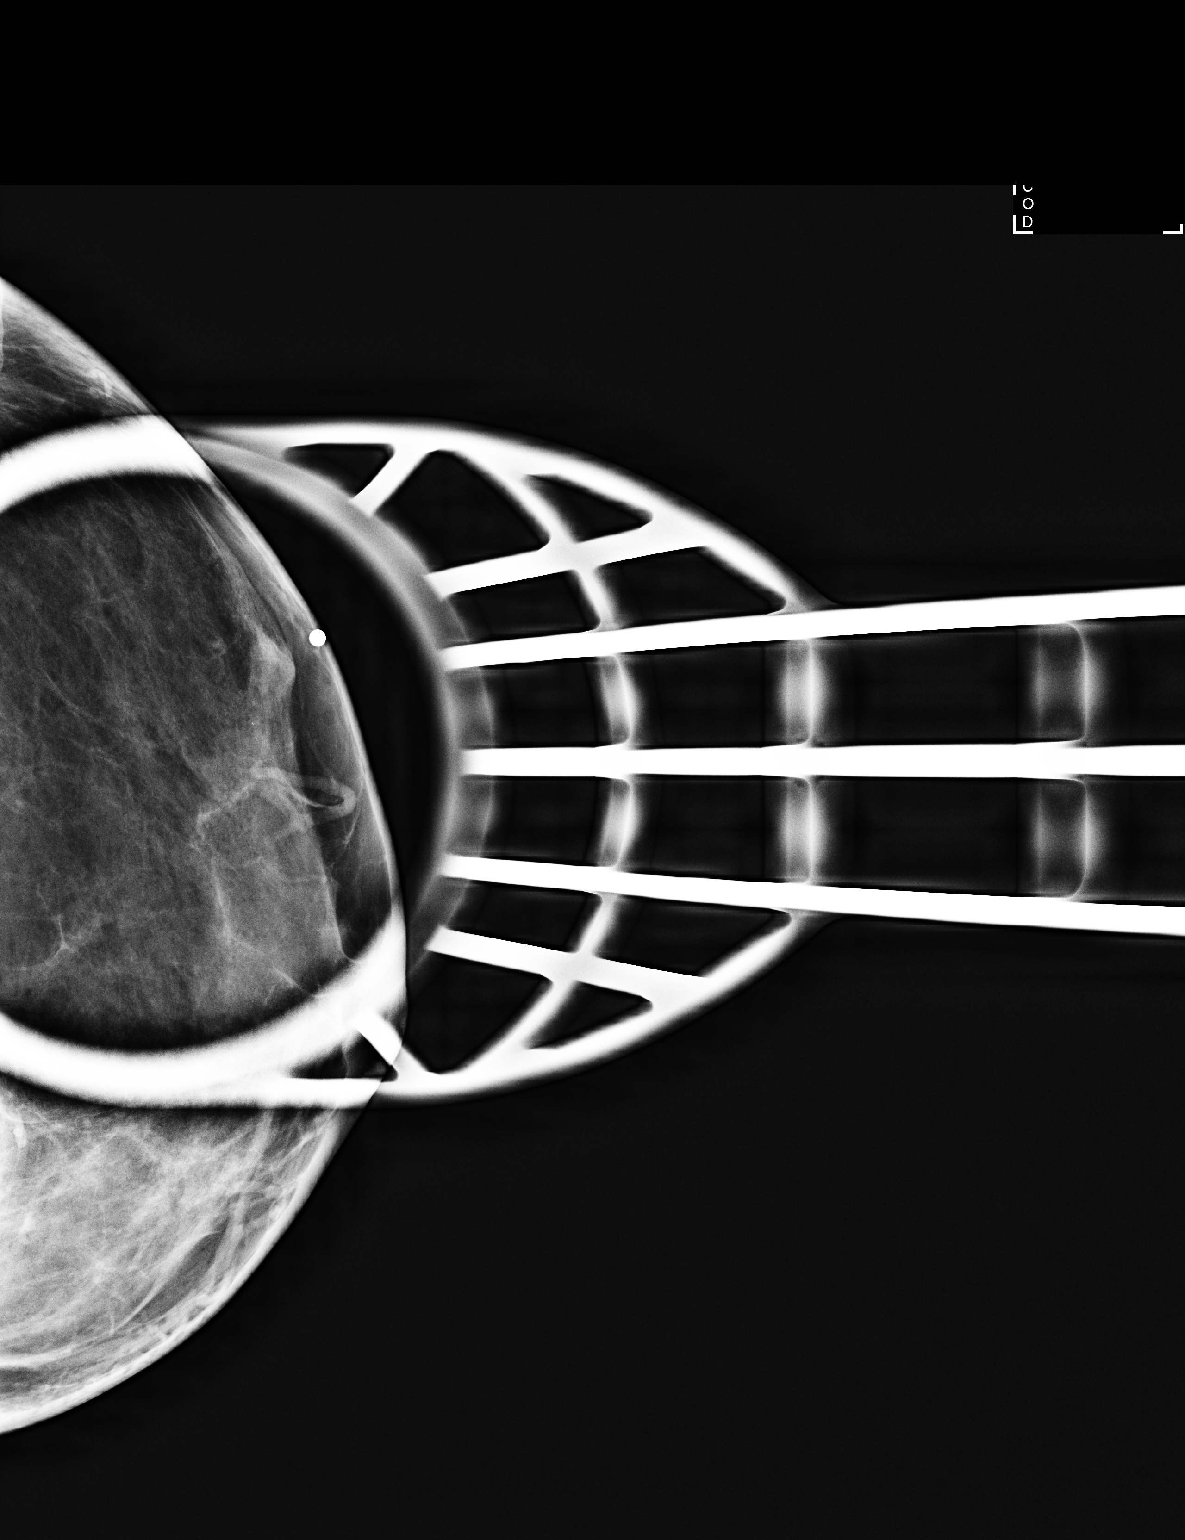

[L MLO]
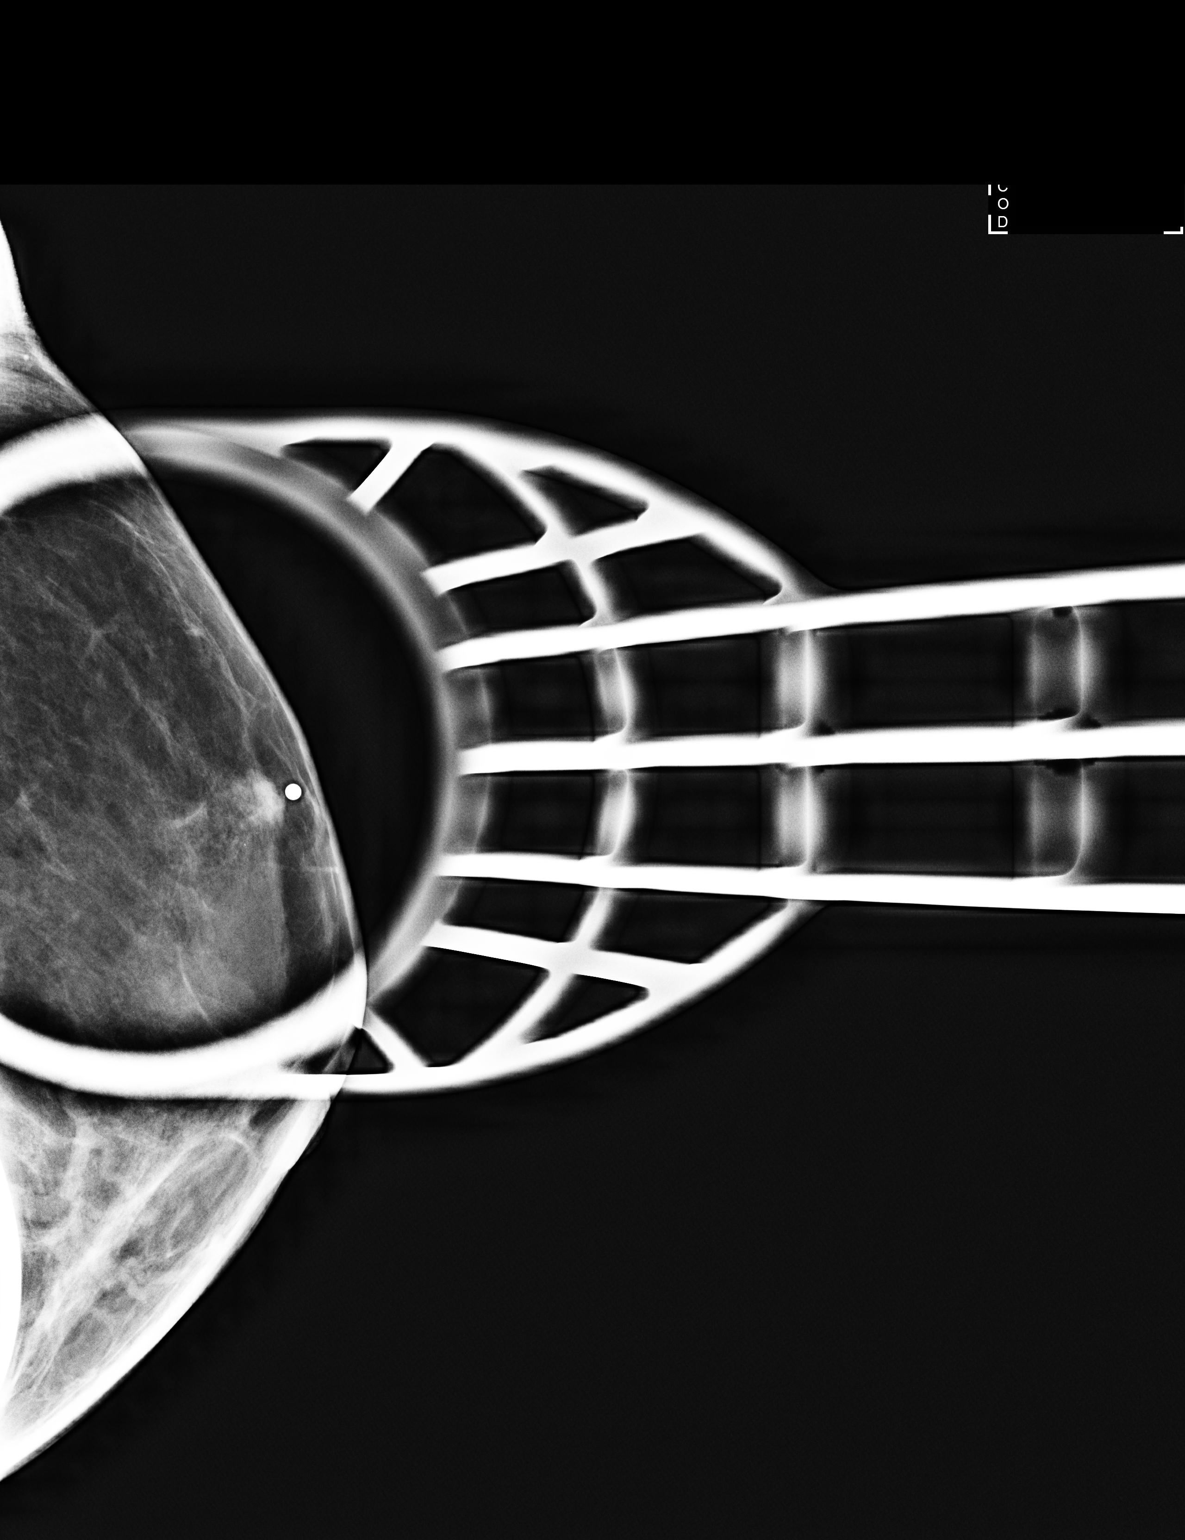

[2 of 2 positions shown; findings below may reference images not displayed]

ACR Breast Density Category b: There are scattered areas of
fibroglandular density.
FINDINGS: Spot compression views confirm presence of a mass within the
upper-outer quadrant of the left breast, also marked with a BB as
palpable. Mass appears irregular and ill defined.

On physical exam, I palpate a discrete mobile superficial nodule in
the 2 o'clock location of the left breast 3 cm from the nipple.

Ultrasound is performed, showing a hypoechoic and anechoic nodule in
the 2 o'clock location of the left breast 3 cm from the nipple
corresponding to the palpable abnormality. Considerations include
apocrine metaplasia or ductal carcinoma in situ. There is no
internal vascularity on Doppler evaluation. Evaluation of the left
axilla is negative for adenopathy.
IMPRESSION: 1. Newly palpable mass irregular in the 2 o'clock location of the
left breast 3 cm from the nipple.
2. Tissue diagnosis is recommended.

RECOMMENDATION:
Ultrasound-guided core biopsy is recommended. We discussed the
procedure for ultrasound-guided core biopsy including the small risk
of implant rupture. Biopsy has been scheduled for the patient on
12/17/2014 at 1 o'clock p.m..

I have discussed the findings and recommendations with the patient.
Results were also provided in writing at the conclusion of the
visit. If applicable, a reminder letter will be sent to the patient
regarding the next appointment.

BI-RADS CATEGORY  4: Suspicious.

## 2016-07-13 ENCOUNTER — Other Ambulatory Visit (INDEPENDENT_AMBULATORY_CARE_PROVIDER_SITE_OTHER): Payer: 59

## 2016-07-13 DIAGNOSIS — E041 Nontoxic single thyroid nodule: Secondary | ICD-10-CM | POA: Diagnosis not present

## 2016-07-13 LAB — T4, FREE: FREE T4: 0.94 ng/dL (ref 0.60–1.60)

## 2016-07-13 LAB — TSH: TSH: 0.9 u[IU]/mL (ref 0.35–4.50)

## 2016-07-19 ENCOUNTER — Other Ambulatory Visit: Payer: 59

## 2016-07-22 ENCOUNTER — Ambulatory Visit (INDEPENDENT_AMBULATORY_CARE_PROVIDER_SITE_OTHER): Payer: 59 | Admitting: Endocrinology

## 2016-07-22 ENCOUNTER — Encounter: Payer: Self-pay | Admitting: Endocrinology

## 2016-07-22 ENCOUNTER — Ambulatory Visit: Payer: 59 | Admitting: Endocrinology

## 2016-07-22 VITALS — BP 110/70 | HR 72 | Temp 98.3°F | Wt 132.0 lb

## 2016-07-22 DIAGNOSIS — E041 Nontoxic single thyroid nodule: Secondary | ICD-10-CM

## 2016-07-22 MED ORDER — SYNTHROID 50 MCG PO TABS: 50.0000 ug | ORAL_TABLET | Freq: Every day | ORAL | 11 refills | Status: DC

## 2016-07-22 NOTE — Progress Notes (Signed)
Patient ID: Paula Mayer, female   DOB: 1976/07/13, 40 y.o.   MRN: [id number]  Reason for Appointment:  Hypothyroidism, followup visit    History of Present Illness:   HYPOTHYROIDISM was first diagnosed  in 1994, following I-131 treatment for a hot nodules associated with subclinical hyperthyroidism.  Initially TSH was 10.1 Initially for a few years she had been on very high doses of thyroid supplement but her dose was progressively reduced later  She has been on a stable dose of 50 mcg Synthroid since about 2011  She was taken off Synthroid because of negative TPO antibodies  but TSH  increased to 8.8 in 08/2012 and she had more fatigue  She has been consistently on 50 g of Synthroid since then.  She does prefer the brand name medication, she thinks she feels jittery with a Generic  Complaints are reported by the patient now are none She does get tired at times but she thinks this is from her stress level at times and other days she feels fairly good On the last visit she was feeling occasionally jittery and not being able to relax However with skipping the dose on Sundays she felt better but 2 months later she started taking this daily on her own since she would not remember to skip the dose  Compliance with the thyroid medication has been as prescribed with taking the tablet in the morning before breakfast.    Wt Readings from Last 3 Encounters:  07/22/16 132 lb (59.9 kg)  07/23/15 130 lb 3.2 oz (59.1 kg)  01/03/15 129 lb 9.6 oz (58.8 kg)    Her thyroid levels have been stable:  Lab Results  Component Value Date   TSH 0.90 07/13/2016   TSH 0.58 07/21/2015   TSH 0.43 12/31/2014   FREET4 0.94 07/13/2016   FREET4 1.13 07/21/2015   FREET4 1.01 12/31/2014      THYROID nodule: She has had previous needle aspirations for her partially cystic thyroid NODULE several years ago  and this was benign.  She does not think she has noticed any recent change in size      No visits with results within 1 Week(s) from this visit.  Latest known visit with results is:  Lab on 07/13/2016  Component Date Value Ref Range Status  . TSH 07/13/2016 0.90  0.35 - 4.50 uIU/mL Final  . Free T4 07/13/2016 0.94  0.60 - 1.60 ng/dL Final      Medication List       Accurate as of 07/22/16 10:05 AM. Always use your most recent med list.          ibuprofen 200 MG tablet Commonly known as:  ADVIL,MOTRIN Take 200 mg by mouth every 6 (six) hours as needed.   SYNTHROID 50 MCG tablet Generic drug:  levothyroxine Take 1 tablet (50 mcg total) by mouth daily before breakfast.        Past Medical History:  Diagnosis Date  . H/O gestational diabetes mellitus, not currently pregnant     No past surgical history on file.  Family History  Problem Relation Age of Onset  . Diabetes Paternal Grandmother     Social History:  reports that she has been smoking Cigarettes.  She does not have any smokeless tobacco history on file. Her alcohol and drug histories are not on file.  Allergies: No Known Allergies   ROS:  She was previously told to have hypercholesterolemia  She still does not have a PCP  Her weight has been stable   She had lost weight previously on her own because of prior history of gestational diabetes and has been able to maintain this reasonably well   Examination:   BP 110/70 (BP Location: Left Arm, Patient Position: Sitting, Cuff Size: Normal)   Pulse 72   Temp 98.3 F (36.8 C) (Oral)   Wt 132 lb (59.9 kg)   BMI 24.94 kg/m   She looks well          NECK:  she has a slightly firm,  smooth thyroid nodule mostly in the isthmus and extending to the right, measuring about 2.5-3 cm  Left lobe is not palpable and there are no cervical lymph nodes enlarged           Assessments/PLAN:   1. Hypothyroidism, this is again mild and stable.  She probably has mild Hashimoto's thyroiditis Subjectively she is feeling quite well  Continues to  take 50 g of levothyroxine, doing subjectively fairly good with this dose now compared to her last visit She is compliant with her medication TSH is normal consistently and not in the lower range  2. Long-standing thyroid nodule, previously aspirated, appears to be stable in size and will be followed clinically  Follow-up in one year  [facility] HospitalKUMAR,Davanta Meuser 07/22/2016, 10:05 AM

## 2016-08-12 ENCOUNTER — Other Ambulatory Visit: Payer: Self-pay | Admitting: Endocrinology

## 2017-07-19 ENCOUNTER — Other Ambulatory Visit (INDEPENDENT_AMBULATORY_CARE_PROVIDER_SITE_OTHER): Payer: 59

## 2017-07-19 DIAGNOSIS — E041 Nontoxic single thyroid nodule: Secondary | ICD-10-CM

## 2017-07-19 LAB — T4, FREE: Free T4: 1.17 ng/dL (ref 0.60–1.60)

## 2017-07-19 LAB — TSH: TSH: 0.04 u[IU]/mL — AB (ref 0.35–4.50)

## 2017-07-21 NOTE — Progress Notes (Signed)
Patient ID: Paula Mayer, female   DOB: January 12, 1976, 41 y.o.   MRN: [id number]  Reason for Appointment:  Hypothyroidism, followup visit    History of Present Illness:   HYPOTHYROIDISM was first diagnosed  in 1994, following I-131 treatment for a hot nodules associated with subclinical hyperthyroidism.  Initially TSH was 10.1 Initially for a few years she had been on very high doses of thyroid supplement but her dose was progressively reduced later  She has been on a stable dose of 50 mcg Synthroid since about 2011  She was taken off Synthroid because of negative TPO antibodies  but TSH  increased to 8.8 in 08/2012 and she had more fatigue  She has been consistently on 50 g of Synthroid since then.  She does prefer the brand name medication, she thinks she feels jittery with generics  Her thyroid levels have been fairly consistent over the last few years  Over the last couple of months she thinks she sometimes feels a little shaky but she thinks it was from her cutting out carbohydrates from her meals Overall she has no fatigue and she actually feels much better with her energy level with losing weight and cutting back on carbohydrates She does get tired at times but she thinks this is from her stress level at times and other days she feels fairly good No complaints of palpitations or excessive heat intolerance  Compliance with the thyroid medication has been as prescribed with taking the tablet in the morning before breakfast.    Wt Readings from Last 3 Encounters:  07/22/17 124 lb (56.2 kg)  07/22/16 132 lb (59.9 kg)  07/23/15 130 lb 3.2 oz (59.1 kg)    Her thyroid levels have been stable:  Lab Results  Component Value Date   TSH 0.04 (L) 07/19/2017   TSH 0.90 07/13/2016   TSH 0.58 07/21/2015   FREET4 1.17 07/19/2017   FREET4 0.94 07/13/2016   FREET4 1.13 07/21/2015      THYROID nodule: She has had previous needle aspirations for her partially cystic  thyroid NODULE several years ago  and this was benign.  She may occasionally have a feeling of difficulty swallowing with certain tablets  The ultrasound report from 2001 showed a mixed cystic/solid nodule on the right side measuring 3.9 cm in size Also left lobe is hypoplastic    Allergies as of 07/22/2017   No Known Allergies     Medication List       Accurate as of 07/22/17  8:17 AM. Always use your most recent med list.          ibuprofen 200 MG tablet Commonly known as:  ADVIL,MOTRIN Take 200 mg by mouth every 6 (six) hours as needed.   SYNTHROID 50 MCG tablet Generic drug:  levothyroxine Take 1 tablet (50 mcg total) by mouth daily before breakfast.   SYNTHROID 50 MCG tablet Generic drug:  levothyroxine TAKE 1 TABLET(50 MCG) BY MOUTH DAILY BEFORE BREAKFAST        Past Medical History:  Diagnosis Date  . H/O gestational diabetes mellitus, not currently pregnant     No past surgical history on file.  Family History  Problem Relation Age of Onset  . Diabetes Paternal Grandmother     Social History:  reports that she has been smoking Cigarettes.  She has never used smokeless tobacco. She reports that she drinks alcohol. She reports that she does not use drugs.  Allergies: No Known Allergies   ROS:  She was previously told to have hypercholesterolemia  She still does not have a PCP   Her weight has been stable   She had lost weight previously on her own because of prior history of gestational diabetes and has been able to maintain this reasonably well   Examination:   BP 104/74   Pulse 80   Ht 5\' 1"  (1.549 m)   Wt 124 lb (56.2 kg)   SpO2 98%   BMI 23.43 kg/m   She looks well          NECK:  she has a slightly firm,  smooth thyroid nodule  in the isthmus and extending to the right, measuring about 2.5-3 cm   Left lobe is not palpable and there are no cervical lymph nodes enlarged           Assessments/PLAN:   1. Hypothyroidism, this has  been mild and previously stable Not clear why her TSH is now low She may be having mild anxiety symptoms with this but no other signs or symptoms of hyperthyroidism along with normal free T4  For now she will reduce her dose to 25 g and follow-up in 2 months again   2. Long-standing thyroid nodule, previously benign and on ultrasound mixed cystic/ solid, appears to be stable in size and will be followed clinically There is a tiny right sided nodule also felt May consider follow-up ultrasound on the next visit   Paula Mayer 07/22/2017, 8:17 AM

## 2017-07-22 ENCOUNTER — Encounter: Payer: Self-pay | Admitting: Endocrinology

## 2017-07-22 ENCOUNTER — Ambulatory Visit (INDEPENDENT_AMBULATORY_CARE_PROVIDER_SITE_OTHER): Payer: 59 | Admitting: Endocrinology

## 2017-07-22 VITALS — BP 104/74 | HR 80 | Ht 61.0 in | Wt 124.0 lb

## 2017-07-22 DIAGNOSIS — E041 Nontoxic single thyroid nodule: Secondary | ICD-10-CM | POA: Diagnosis not present

## 2017-07-22 DIAGNOSIS — E038 Other specified hypothyroidism: Secondary | ICD-10-CM | POA: Diagnosis not present

## 2017-07-22 MED ORDER — SYNTHROID 25 MCG PO TABS: 25.0000 ug | ORAL_TABLET | Freq: Every day | ORAL | 3 refills | Status: DC

## 2017-07-22 NOTE — Patient Instructions (Signed)
1/2 pill daily 

## 2017-09-15 ENCOUNTER — Other Ambulatory Visit (INDEPENDENT_AMBULATORY_CARE_PROVIDER_SITE_OTHER): Payer: 59

## 2017-09-15 ENCOUNTER — Other Ambulatory Visit: Payer: Self-pay | Admitting: Endocrinology

## 2017-09-15 DIAGNOSIS — E038 Other specified hypothyroidism: Secondary | ICD-10-CM

## 2017-09-15 DIAGNOSIS — E7800 Pure hypercholesterolemia, unspecified: Secondary | ICD-10-CM

## 2017-09-15 LAB — TSH: TSH: 1.4 u[IU]/mL (ref 0.35–4.50)

## 2017-09-15 LAB — T4, FREE: FREE T4: 0.76 ng/dL (ref 0.60–1.60)

## 2017-09-15 LAB — T3, FREE: T3 FREE: 3.6 pg/mL (ref 2.3–4.2)

## 2017-09-16 LAB — LIPID PANEL
CHOL/HDL RATIO: 3
Cholesterol: 138 mg/dL (ref 0–200)
HDL: 52.1 mg/dL (ref >39.00)
LDL Cholesterol: 74 mg/dL (ref 0–99)
NonHDL: 86.14
TRIGLYCERIDES: 61 mg/dL (ref 0.0–149.0)
VLDL: 12.2 mg/dL (ref 0.0–40.0)

## 2017-09-20 ENCOUNTER — Ambulatory Visit (INDEPENDENT_AMBULATORY_CARE_PROVIDER_SITE_OTHER): Payer: 59 | Admitting: Endocrinology

## 2017-09-20 ENCOUNTER — Encounter: Payer: Self-pay | Admitting: Endocrinology

## 2017-09-20 VITALS — BP 110/72 | HR 70 | Ht 61.0 in | Wt 125.8 lb

## 2017-09-20 DIAGNOSIS — E038 Other specified hypothyroidism: Secondary | ICD-10-CM

## 2017-09-20 NOTE — Progress Notes (Signed)
Patient ID: Paula Mayer, female   DOB: [birth year], 41 y.o.   MRN: [id number]  Reason for Appointment:  Hypothyroidism, followup visit    History of Present Illness:   HYPOTHYROIDISM was first diagnosed  in 1994, following I-131 treatment for a hot nodules associated with subclinical hyperthyroidism.  Initially TSH was 10.1 Initially for a few years she had been on very high doses of thyroid supplement but her dose was progressively reduced later  She was taken off Synthroid because of negative TPO antibodies  but TSH  increased to 8.8 in 08/2012 and she had more fatigue; she was started back on Synthroid then She had been on a stable dose of 50 mcg Synthroid since about 2011   She does prefer the brand name medication, she thinks she feels jittery with generics  On her last visit in 8/18 she was saying that she sometimes feels a little shaky but she thinks it was from her cutting out carbohydrates from her meals.  No other complaints suggestive of hyperthyroidism  However her TSH was low at 0.04 without increased free T4 Since then she is taking only 25 g levothyroxine using brand name Synthroid  Overall she has no fatigue and she actually feels much better and does not feel jittery or shaky as before  Compliance with the thyroid medication has been as prescribed with taking the tablet in the morning before breakfast.    Wt Readings from Last 3 Encounters:  09/20/17 125 lb 12.8 oz (57.1 kg)  07/22/17 124 lb (56.2 kg)  07/22/16 132 lb (59.9 kg)    Her thyroid levels have been stable:  Lab Results  Component Value Date   TSH 1.40 09/15/2017   TSH 0.04 (L) 07/19/2017   TSH 0.90 07/13/2016   FREET4 0.76 09/15/2017   FREET4 1.17 07/19/2017   FREET4 0.94 07/13/2016      THYROID nodule: She has had previous needle aspirations for her partially cystic thyroid NODULE several years ago  and this was benign.  She may occasionally have a feeling of difficulty  swallowing with certain tablets  The ultrasound report from 2001 showed a mixed cystic/solid nodule on the right side measuring 3.9 cm in size Also left lobe is hypoplastic    Allergies as of 09/20/2017   No Known Allergies     Medication List       Accurate as of 09/20/17  8:07 AM. Always use your most recent med list.          ibuprofen 200 MG tablet Commonly known as:  ADVIL,MOTRIN Take 200 mg by mouth every 6 (six) hours as needed.   MULTIPLE VITAMIN PO Take 1 tablet by mouth daily.   PROBIOTIC-10 Caps Take 1 capsule by mouth daily.   SYNTHROID 25 MCG tablet Generic drug:  levothyroxine Take 1 tablet (25 mcg total) by mouth daily before breakfast.        Past Medical History:  Diagnosis Date  . H/O gestational diabetes mellitus, not currently pregnant     No past surgical history on file.  Family History  Problem Relation Age of Onset  . Diabetes Paternal Grandmother     Social History:  reports that she has been smoking Cigarettes.  She has never used smokeless tobacco. She reports that she drinks alcohol. She reports that she does not use drugs.  Allergies: No Known Allergies   ROS:  She was previously told to have hypercholesterolemia, LDL from 2 years ago was 104 She  says she normally has only a total cholesterol checked with her work health screening and does not know the last results She is trying to eat a healthy diet with reducing red meat and a lot of carbohydrates also Currently not exercising much and also has not cut back on smoking  She still does not have a PCP    Examination:   BP 110/72   Pulse (!) 110   Ht 5\' 1"  (1.549 m)   Wt 125 lb 12.8 oz (57.1 kg)   SpO2 97%   BMI 23.77 kg/m   She looks well          NECK:  she has a slightly firm,  smooth thyroid nodule mostly in the isthmus and extending below sternal notch, measuring about 3 cm   Left lobe is not palpable and there are no cervical lymph nodes enlarged            Assessments/PLAN:   1. Hypothyroidism, this has been mild and previously stable With only taking 50 g Synthroid her TSH was low and now she is taking 25 g With this she is subjectively feeling better Also her TSH is back to normal She will continue the same dose for another 6 months  2. Long-standing thyroid nodule, previously benign and on ultrasound mixed cystic/ solid,  stable in size     Paula Mayer 09/20/2017, 8:07 AM

## 2017-11-23 ENCOUNTER — Other Ambulatory Visit: Payer: Self-pay | Admitting: Endocrinology

## 2017-12-23 ENCOUNTER — Other Ambulatory Visit: Payer: Self-pay | Admitting: Endocrinology

## 2018-01-22 ENCOUNTER — Other Ambulatory Visit: Payer: Self-pay | Admitting: Endocrinology

## 2018-03-16 ENCOUNTER — Other Ambulatory Visit (INDEPENDENT_AMBULATORY_CARE_PROVIDER_SITE_OTHER): Payer: 59

## 2018-03-16 DIAGNOSIS — E038 Other specified hypothyroidism: Secondary | ICD-10-CM

## 2018-03-16 LAB — T4, FREE: Free T4: 0.72 ng/dL (ref 0.60–1.60)

## 2018-03-16 LAB — TSH: TSH: 1.15 u[IU]/mL (ref 0.35–4.50)

## 2018-03-19 NOTE — Progress Notes (Signed)
Patient ID: Paula Mayer, female   DOB: 26-Oct-1976, 42 y.o.   MRN: [id number]   Reason for Appointment: Thyroid conditions, followup visit    History of Present Illness:   HYPOTHYROIDISM was first diagnosed  in 1994, following I-131 treatment for a hot nodules associated with subclinical hyperthyroidism.  Initially TSH was 10.1 Initially for a few years she had been on very high doses of thyroid supplement but her dose was progressively reduced later  She was taken off Synthroid because of negative TPO antibodies  but TSH  increased to 8.8 in 08/2012 and she had more fatigue; she was started back on Synthroid then She had been on a stable dose of 50 mcg Synthroid since about 2011   She does prefer the brand name medication, she thinks she feels jittery with generics  In 8/18 she was saying that she sometimes feels a little shaky but she thinks it was from her cutting out carbohydrates from her meals.   No other complaints suggestive of hyperthyroidism At that time her TSH was low at 0.04 without increased free T4 in 06/2017  Since then she is taking only 25 g levothyroxine using brand name Synthroid She feels like the energy level excellent Weight is slightly better, she tries to cut back on her portions and carbs  She is now still euthyroid with the small dose no change in TSH  Compliance with the thyroid medication has been  consistent with taking the tablet in the morning before breakfast and no other supplements at that time.    Wt Readings from Last 3 Encounters:  03/20/18 121 lb 6.4 oz (55.1 kg)  09/20/17 125 lb 12.8 oz (57.1 kg)  07/22/17 124 lb (56.2 kg)    Her thyroid levels have been stable:  Lab Results  Component Value Date   TSH 1.15 03/16/2018   TSH 1.40 09/15/2017   TSH 0.04 (L) 07/19/2017   FREET4 0.72 03/16/2018   FREET4 0.76 09/15/2017   FREET4 1.17 07/19/2017      THYROID nodule: She has had previous needle aspirations for her  partially cystic thyroid NODULE several years ago  and this was benign.  She may occasionally have a feeling of difficulty swallowing with certain tablets  The ultrasound report from 2001 showed a mixed cystic/solid nodule on the right side measuring 3.9 cm in size Also left lobe is hypoplastic    Allergies as of 03/20/2018   No Known Allergies     Medication List        Accurate as of 03/20/18  8:08 AM. Always use your most recent med list.          ibuprofen 200 MG tablet Commonly known as:  ADVIL,MOTRIN Take 200 mg by mouth every 6 (six) hours as needed.   MULTIPLE VITAMIN PO Take 1 tablet by mouth daily.   PROBIOTIC-10 Caps Take 1 capsule by mouth daily.   SYNTHROID 25 MCG tablet Generic drug:  levothyroxine TAKE 1 TABLET(25 MCG) BY MOUTH DAILY BEFORE BREAKFAST        Past Medical History:  Diagnosis Date  . H/O gestational diabetes mellitus, not currently pregnant     History reviewed. No pertinent surgical history.  Family History  Problem Relation Age of Onset  . Diabetes Paternal Grandmother   . CAD Father   . Cancer Father     Social History:  reports that she has been smoking cigarettes.  She has never used smokeless tobacco. She reports that she  drinks alcohol. She reports that she does not use drugs.  Allergies: No Known Allergies   ROS:  She was previously told to have hypercholesterolemia, LDL from 2 years ago was 104 She says she normally has only a total cholesterol checked with her work health screening in April She was told that her HDL was 49 and LDL was in the 70s  She is trying to eat a healthy diet with reducing red meat  Lab Results  Component Value Date   CHOL 138 09/15/2017   HDL 52.10 09/15/2017   LDLCALC 74 09/15/2017   TRIG 61.0 09/15/2017   CHOLHDL 3 09/15/2017     She still does not have a PCP  SMOKING: She is still smoking a pack a day and she thinks she may try to quit later on when she is not so much pressure  at work    Examination:   BP 110/68 (BP Location: Left Arm, Patient Position: Sitting, Cuff Size: Normal)   Pulse 88   Ht  (1.549 m)   Wt 121 lb 6.4 oz (55.1 kg)   SpO2 98%   BMI 22.94 kg/m   She looks well          NECK:  she has a rounded smooth thyroid nodule in the isthmus and extending just below sternal notch, measuring about 3 cm.  This has a fleshy texture   Lateral lobes are not palpable and there are no cervical lymph nodes          Assessments/PLAN:   1. Hypothyroidism, this has been mild presenting at age 87  With only taking 25 g is actually feeling quite good and TSH is still very normal She takes brand-name Synthroid She is very compliant with taking this and agrees to continue this unchanged She will come back in here for follow-up  2. Long-standing thyroid nodule, previously benign and on ultrasound mixed cystic/ solid No change in size on exam 3.  Smoking cessation: Emphasized the need to start cutting back on her smoking and she will try to do this later this year she has less work pressure    Reather Littler 03/20/2018, 8:08 AM

## 2018-03-20 ENCOUNTER — Encounter: Payer: Self-pay | Admitting: Endocrinology

## 2018-03-20 ENCOUNTER — Ambulatory Visit: Payer: 59 | Admitting: Endocrinology

## 2018-03-20 VITALS — BP 110/68 | HR 88 | Ht 61.0 in | Wt 121.4 lb

## 2018-03-20 DIAGNOSIS — E038 Other specified hypothyroidism: Secondary | ICD-10-CM | POA: Diagnosis not present

## 2018-03-20 DIAGNOSIS — E041 Nontoxic single thyroid nodule: Secondary | ICD-10-CM | POA: Diagnosis not present

## 2018-03-20 DIAGNOSIS — E7800 Pure hypercholesterolemia, unspecified: Secondary | ICD-10-CM

## 2018-04-23 ENCOUNTER — Other Ambulatory Visit: Payer: Self-pay | Admitting: Endocrinology

## 2018-06-14 ENCOUNTER — Other Ambulatory Visit: Payer: Self-pay | Admitting: Endocrinology

## 2018-06-28 ENCOUNTER — Other Ambulatory Visit: Payer: Self-pay

## 2018-06-28 ENCOUNTER — Telehealth: Payer: Self-pay | Admitting: Emergency Medicine

## 2018-06-28 MED ORDER — SYNTHROID 25 MCG PO TABS: ORAL_TABLET | ORAL | 10 refills | Status: DC

## 2018-06-28 NOTE — Telephone Encounter (Signed)
She is supposed to be continuing the 25 mcg as per my last note, please change.  She can take a half of the 50 mcg in the meantime

## 2018-06-28 NOTE — Telephone Encounter (Signed)
Please advise on which dose patient should take based on these symptoms.

## 2018-06-28 NOTE — Telephone Encounter (Signed)
Pt called stating she needs a refill on her SYNTHROID 25 MCG tablet. A prescription was sent in on 06/14/18 for 50 MCG and it is too much and is making her lightheaded and dizzy. Please advise thanks.

## 2018-06-28 NOTE — Telephone Encounter (Signed)
I have called patient & made her aware that prescription was sent for 25 mcg only. I have spoken with [facility] to have them d/c the 50 mcg.

## 2018-07-05 ENCOUNTER — Other Ambulatory Visit: Payer: Self-pay

## 2018-07-05 ENCOUNTER — Encounter (HOSPITAL_BASED_OUTPATIENT_CLINIC_OR_DEPARTMENT_OTHER): Payer: Self-pay | Admitting: *Deleted

## 2018-07-05 NOTE — Progress Notes (Signed)
Patient has lab appt for CBC 07/11/18. Needs UPT DOS. Will take synthroid with a sip of water DOS. NPO after MN. Arrive at 0700.

## 2018-07-11 ENCOUNTER — Encounter (HOSPITAL_COMMUNITY)
Admission: RE | Admit: 2018-07-11 | Discharge: 2018-07-11 | Disposition: A | Discharge status: Home / Self Care | Payer: 59 | Source: Ambulatory Visit | Attending: Obstetrics and Gynecology | Admitting: Obstetrics and Gynecology

## 2018-07-11 DIAGNOSIS — E039 Hypothyroidism, unspecified: Secondary | ICD-10-CM | POA: Diagnosis not present

## 2018-07-11 DIAGNOSIS — Z79899 Other long term (current) drug therapy: Secondary | ICD-10-CM | POA: Diagnosis not present

## 2018-07-11 DIAGNOSIS — Z302 Encounter for sterilization: Secondary | ICD-10-CM | POA: Diagnosis present

## 2018-07-11 DIAGNOSIS — Z79890 Hormone replacement therapy (postmenopausal): Secondary | ICD-10-CM | POA: Diagnosis not present

## 2018-07-11 DIAGNOSIS — F17210 Nicotine dependence, cigarettes, uncomplicated: Secondary | ICD-10-CM | POA: Diagnosis not present

## 2018-07-11 LAB — CBC
HCT: 44.6 % (ref 36.0–46.0)
Hemoglobin: 15.2 g/dL — ABNORMAL HIGH (ref 12.0–15.0)
MCH: 31.4 pg (ref 26.0–34.0)
MCHC: 34.1 g/dL (ref 30.0–36.0)
MCV: 92.1 fL (ref 78.0–100.0)
PLATELETS: 236 10*3/uL (ref 150–400)
RBC: 4.84 MIL/uL (ref 3.87–5.11)
RDW: 12.7 % (ref 11.5–15.5)
WBC: 7.9 10*3/uL (ref 4.0–10.5)

## 2018-07-13 NOTE — H&P (Signed)
Paula Mayer is an 42 y.o. female. She desires permanent sterility and is admitted for laparoscopic BTL.   Pertinent Gynecological History: Last mammogram: normal Date: 09/2017 Last pap: normal Date: 06/2017 OB History: G4, P2022   Menstrual History: Patient's last menstrual period was 06/25/2018.    Past Medical History:  Diagnosis Date  . H/O gestational diabetes mellitus, not currently pregnant   . Hypothyroidism   . PONV (postoperative nausea and vomiting)     Past Surgical History:  Procedure Laterality Date  . BREAST ENHANCEMENT SURGERY  2015  . TONSILLECTOMY    . WISDOM TOOTH EXTRACTION      Family History  Problem Relation Age of Onset  . Diabetes Paternal Grandmother   . CAD Father   . Cancer Father     Social History:  reports that she has been smoking cigarettes. She has never used smokeless tobacco. She reports that she drinks alcohol. She reports that she does not use drugs.  Allergies: No Known Allergies  No medications prior to admission.    Review of Systems  Respiratory: Negative.   Cardiovascular: Negative.     Height 5\' 1"  (1.549 m), weight 56.7 kg, last menstrual period 06/25/2018. Physical Exam  Constitutional: She appears well-developed and well-nourished.  Neck: Neck supple. No thyromegaly present.  Cardiovascular: Normal rate, regular rhythm and normal heart sounds.  No murmur heard. Respiratory: Effort normal and breath sounds normal. No respiratory distress. She has no wheezes.  GI: Soft. She exhibits no distension and no mass. There is no tenderness.  Genitourinary: Vagina normal and uterus normal.    No results found for this or any previous visit (from the past 24 hour(s)).  No results found.  Assessment/Plan: Desires permanent sterility.  Discussed all options for contraception, surgical procedure, risks, failure rate and permanency.  Will admit for laparoscopic bilateral salpingectomy.  Leighton Roachodd D Sadiya Durand 07/13/2018,  1:00 PM

## 2018-07-14 ENCOUNTER — Ambulatory Visit (HOSPITAL_BASED_OUTPATIENT_CLINIC_OR_DEPARTMENT_OTHER)
Admission: RE | Admit: 2018-07-14 | Discharge: 2018-07-14 | Disposition: A | Discharge status: Home / Self Care | Payer: 59 | Source: Ambulatory Visit | Attending: Obstetrics and Gynecology | Admitting: Obstetrics and Gynecology

## 2018-07-14 ENCOUNTER — Ambulatory Visit (HOSPITAL_BASED_OUTPATIENT_CLINIC_OR_DEPARTMENT_OTHER): Payer: 59 | Admitting: Certified Registered"

## 2018-07-14 ENCOUNTER — Encounter (HOSPITAL_BASED_OUTPATIENT_CLINIC_OR_DEPARTMENT_OTHER): Payer: Self-pay

## 2018-07-14 ENCOUNTER — Encounter (HOSPITAL_BASED_OUTPATIENT_CLINIC_OR_DEPARTMENT_OTHER)
Admission: RE | Disposition: A | Discharge status: Home / Self Care | Payer: Self-pay | Source: Ambulatory Visit | Attending: Obstetrics and Gynecology

## 2018-07-14 DIAGNOSIS — Z302 Encounter for sterilization: Secondary | ICD-10-CM | POA: Diagnosis not present

## 2018-07-14 DIAGNOSIS — F17210 Nicotine dependence, cigarettes, uncomplicated: Secondary | ICD-10-CM | POA: Insufficient documentation

## 2018-07-14 DIAGNOSIS — Z79890 Hormone replacement therapy (postmenopausal): Secondary | ICD-10-CM | POA: Insufficient documentation

## 2018-07-14 DIAGNOSIS — Z79899 Other long term (current) drug therapy: Secondary | ICD-10-CM | POA: Insufficient documentation

## 2018-07-14 DIAGNOSIS — E039 Hypothyroidism, unspecified: Secondary | ICD-10-CM | POA: Insufficient documentation

## 2018-07-14 HISTORY — DX: Other specified postprocedural states: R11.2

## 2018-07-14 HISTORY — DX: Other specified postprocedural states: Z98.890

## 2018-07-14 HISTORY — PX: LAPAROSCOPIC BILATERAL SALPINGECTOMY: SHX5889

## 2018-07-14 HISTORY — DX: Hypothyroidism, unspecified: E03.9

## 2018-07-14 LAB — POCT PREGNANCY, URINE: Preg Test, Ur: NEGATIVE

## 2018-07-14 SURGERY — SALPINGECTOMY, BILATERAL, LAPAROSCOPIC
Anesthesia: General | Laterality: Bilateral

## 2018-07-14 MED ORDER — SCOPOLAMINE 1 MG/3DAYS TD PT72
1.0000 | MEDICATED_PATCH | Freq: Once | TRANSDERMAL | Status: DC
  Administered 2018-07-14: 1.5 mg via TRANSDERMAL
  Filled 2018-07-14: qty 1

## 2018-07-14 MED ORDER — ESMOLOL HCL 100 MG/10ML IV SOLN
INTRAVENOUS | Status: AC
  Filled 2018-07-14: qty 10

## 2018-07-14 MED ORDER — LACTATED RINGERS IV SOLN
INTRAVENOUS | Status: DC
  Filled 2018-07-14: qty 1000

## 2018-07-14 MED ORDER — DEXAMETHASONE SODIUM PHOSPHATE 10 MG/ML IJ SOLN
INTRAMUSCULAR | Status: AC
  Filled 2018-07-14: qty 1

## 2018-07-14 MED ORDER — HYDROCODONE-ACETAMINOPHEN 5-325 MG PO TABS: 1.0000 | ORAL_TABLET | Freq: Four times a day (QID) | ORAL | 0 refills | Status: DC | PRN

## 2018-07-14 MED ORDER — ROCURONIUM BROMIDE 100 MG/10ML IV SOLN
INTRAVENOUS | Status: DC | PRN
  Administered 2018-07-14: 40 mg via INTRAVENOUS

## 2018-07-14 MED ORDER — KETOROLAC TROMETHAMINE 30 MG/ML IJ SOLN
INTRAMUSCULAR | Status: AC
  Filled 2018-07-14: qty 1

## 2018-07-14 MED ORDER — ONDANSETRON HCL 4 MG/2ML IJ SOLN
INTRAMUSCULAR | Status: AC
  Filled 2018-07-14: qty 2

## 2018-07-14 MED ORDER — MIDAZOLAM HCL 2 MG/2ML IJ SOLN
INTRAMUSCULAR | Status: AC
  Filled 2018-07-14: qty 2

## 2018-07-14 MED ORDER — SUGAMMADEX SODIUM 200 MG/2ML IV SOLN
INTRAVENOUS | Status: AC
  Filled 2018-07-14: qty 2

## 2018-07-14 MED ORDER — FENTANYL CITRATE (PF) 100 MCG/2ML IJ SOLN
25.0000 ug | INTRAMUSCULAR | Status: DC | PRN
  Filled 2018-07-14: qty 1

## 2018-07-14 MED ORDER — MIDAZOLAM HCL 5 MG/5ML IJ SOLN
INTRAMUSCULAR | Status: DC | PRN
  Administered 2018-07-14: 2 mg via INTRAVENOUS

## 2018-07-14 MED ORDER — BUPIVACAINE HCL (PF) 0.25 % IJ SOLN
INTRAMUSCULAR | Status: DC | PRN
  Administered 2018-07-14: 11 mL

## 2018-07-14 MED ORDER — ESMOLOL HCL 100 MG/10ML IV SOLN
INTRAVENOUS | Status: DC | PRN
  Administered 2018-07-14: 20 mg via INTRAVENOUS
  Administered 2018-07-14: 10 mg via INTRAVENOUS

## 2018-07-14 MED ORDER — KETOROLAC TROMETHAMINE 30 MG/ML IJ SOLN
INTRAMUSCULAR | Status: DC | PRN
  Administered 2018-07-14: 30 mg via INTRAVENOUS

## 2018-07-14 MED ORDER — DEXAMETHASONE SODIUM PHOSPHATE 10 MG/ML IJ SOLN
INTRAMUSCULAR | Status: DC | PRN
  Administered 2018-07-14: 10 mg via INTRAVENOUS

## 2018-07-14 MED ORDER — ACETAMINOPHEN 500 MG PO TABS
1000.0000 mg | ORAL_TABLET | Freq: Once | ORAL | Status: DC
  Filled 2018-07-14: qty 2

## 2018-07-14 MED ORDER — BUPIVACAINE HCL (PF) 0.25 % IJ SOLN
INTRAMUSCULAR | Status: AC
  Filled 2018-07-14: qty 30

## 2018-07-14 MED ORDER — ONDANSETRON HCL 4 MG/2ML IJ SOLN
INTRAMUSCULAR | Status: DC | PRN
  Administered 2018-07-14 (×2): 4 mg via INTRAVENOUS

## 2018-07-14 MED ORDER — FENTANYL CITRATE (PF) 100 MCG/2ML IJ SOLN
INTRAMUSCULAR | Status: DC | PRN
  Administered 2018-07-14 (×2): 50 ug via INTRAVENOUS

## 2018-07-14 MED ORDER — PROPOFOL 10 MG/ML IV BOLUS
INTRAVENOUS | Status: AC
  Filled 2018-07-14: qty 20

## 2018-07-14 MED ORDER — PROPOFOL 10 MG/ML IV BOLUS
INTRAVENOUS | Status: DC | PRN
  Administered 2018-07-14: 150 mg via INTRAVENOUS

## 2018-07-14 MED ORDER — PROMETHAZINE HCL 25 MG/ML IJ SOLN
6.2500 mg | INTRAMUSCULAR | Status: DC | PRN
  Filled 2018-07-14: qty 1

## 2018-07-14 MED ORDER — LIDOCAINE 2% (20 MG/ML) 5 ML SYRINGE
INTRAMUSCULAR | Status: AC
  Filled 2018-07-14: qty 5

## 2018-07-14 MED ORDER — LACTATED RINGERS IV SOLN
INTRAVENOUS | Status: DC
  Administered 2018-07-14 (×2): via INTRAVENOUS
  Filled 2018-07-14: qty 1000

## 2018-07-14 MED ORDER — SCOPOLAMINE 1 MG/3DAYS TD PT72
MEDICATED_PATCH | TRANSDERMAL | Status: AC
  Filled 2018-07-14: qty 1

## 2018-07-14 MED ORDER — 0.9 % SODIUM CHLORIDE (POUR BTL) OPTIME
TOPICAL | Status: DC | PRN
  Administered 2018-07-14: 1000 mL

## 2018-07-14 MED ORDER — SUGAMMADEX SODIUM 200 MG/2ML IV SOLN
INTRAVENOUS | Status: DC | PRN
  Administered 2018-07-14: 100 mg via INTRAVENOUS

## 2018-07-14 MED ORDER — LIDOCAINE 2% (20 MG/ML) 5 ML SYRINGE
INTRAMUSCULAR | Status: DC | PRN
  Administered 2018-07-14: 80 mg via INTRAVENOUS

## 2018-07-14 MED ORDER — FENTANYL CITRATE (PF) 100 MCG/2ML IJ SOLN
INTRAMUSCULAR | Status: AC
  Filled 2018-07-14: qty 2

## 2018-07-14 SURGICAL SUPPLY — 25 items
ADH SKN CLS APL DERMABOND .7 (GAUZE/BANDAGES/DRESSINGS) ×1
CATH ROBINSON RED A/P 16FR (CATHETERS) ×3 IMPLANT
COVER MAYO STAND STRL (DRAPES) ×3 IMPLANT
DERMABOND ADVANCED (GAUZE/BANDAGES/DRESSINGS) ×2
DERMABOND ADVANCED .7 DNX12 (GAUZE/BANDAGES/DRESSINGS) ×1 IMPLANT
DRSG COVADERM PLUS 2X2 (GAUZE/BANDAGES/DRESSINGS) IMPLANT
DURAPREP 26ML APPLICATOR (WOUND CARE) ×3 IMPLANT
GLOVE BIO SURGEON STRL SZ8 (GLOVE) ×3 IMPLANT
GLOVE ORTHO TXT STRL SZ7.5 (GLOVE) ×3 IMPLANT
GOWN STRL REUS W/TWL XL LVL3 (GOWN DISPOSABLE) ×3 IMPLANT
NEEDLE INSUFFLATION 120MM (ENDOMECHANICALS) ×3 IMPLANT
NS IRRIG 500ML POUR BTL (IV SOLUTION) ×3 IMPLANT
PACK LAPAROSCOPY BASIN (CUSTOM PROCEDURE TRAY) ×2 IMPLANT
PACK TRENDGUARD 450 HYBRID PRO (MISCELLANEOUS) IMPLANT
PAD OB MATERNITY 4.3X12.25 (PERSONAL CARE ITEMS) ×3 IMPLANT
SUT VIC AB 3-0 CTX 36 (SUTURE) IMPLANT
SUT VIC AB 3-0 PS2 18 (SUTURE) ×3
SUT VIC AB 3-0 PS2 18XBRD (SUTURE) ×1 IMPLANT
SUT VICRYL 0 UR6 27IN ABS (SUTURE) ×3 IMPLANT
TOWEL OR 17X24 6PK STRL BLUE (TOWEL DISPOSABLE) ×6 IMPLANT
TRENDGUARD 450 HYBRID PRO PACK (MISCELLANEOUS) ×3
TROCAR XCEL NON-BLD 11X100MML (ENDOMECHANICALS) ×3 IMPLANT
TROCAR XCEL NON-BLD 5MMX100MML (ENDOMECHANICALS) ×3 IMPLANT
TUBING INSUF HEATED (TUBING) ×3 IMPLANT
WARMER LAPAROSCOPE (MISCELLANEOUS) ×3 IMPLANT

## 2018-07-14 NOTE — Interval H&P Note (Signed)
History and Physical Interval Note:  07/14/2018 8:30 AM  Paula Mayer  has presented today for surgery, with the diagnosis of sterilization  The various methods of treatment have been discussed with the patient and family. After consideration of risks, benefits and other options for treatment, the patient has consented to  Procedure(s): LAPAROSCOPIC TUBAL LIGATION (Bilateral) as a surgical intervention .  The patient's history has been reviewed, patient examined, no change in status, stable for surgery.  I have reviewed the patient's chart and labs.  Questions were answered to the patient's satisfaction.     Leighton Roachodd D Arshan Jabs

## 2018-07-14 NOTE — Discharge Instructions (Signed)
Routine instructions for laparoscopy  Laparoscopy- Care After Refer to this sheet in the next few weeks. These instructions provide you with information about caring for yourself after your procedure. Your health care provider may also give you more specific instructions. Your treatment has been planned according to current medical practices, but problems sometimes occur. Call your health care provider if you have any problems or questions after your procedure. What can I expect after the procedure? After the procedure, it is common to have:  A sore throat.  Discomfort in your shoulder.  Mild discomfort or cramping in your abdomen.  Gas pains.  Pain or soreness in the area where the surgical cut (incision) was made.  A bloated feeling.  Tiredness.  Nausea.  Vomiting.  Follow these instructions at home: Medicines  Take over-the-counter and prescription medicines only as told by your health care provider.  Do not take aspirin because it can cause bleeding.  Do not drive or operate heavy machinery while taking prescription pain medicine. Activity  Rest for the rest of the day.  Return to your normal activities as told by your health care provider. Ask your health care provider what activities are safe for you. Incision care   Follow instructions from your health care provider about how to take care of your incision. Make sure you: ? Wash your hands with soap and water before you change your bandage (dressing). If soap and water are not available, use hand sanitizer. ? Change your dressing as told by your health care provider. ? Leave stitches (sutures) in place. They may need to stay in place for 2 weeks or longer.  Check your incision area every day for signs of infection. Check for: ? More redness, swelling, or pain. ? More fluid or blood. ? Warmth. ? Pus or a bad smell. Other Instructions  Do not take baths, swim, or use a hot tub until your health care provider  approves. You may take showers.  Keep all follow-up visits as told by your health care provider. This is important.  Have someone help you with your daily household tasks for the first few days. Contact a health care provider if:  You have more redness, swelling, or pain around your incision.  Your incision feels warm to the touch.  You have pus or a bad smell coming from your incision.  The edges of your incision break open after the sutures have been removed.  Your pain does not improve after 2-3 days.  You have a rash.  You repeatedly become dizzy or light-headed.  Your pain medicine is not helping.  You are constipated. Get help right away if:  You have a fever.  You faint.  You have increasing pain in your abdomen.  You have severe pain in one or both of your shoulders.  You have fluid or blood coming from your sutures or from your vagina.  You have shortness of breath or difficulty breathing.  You have chest pain or leg pain.  You have ongoing nausea, vomiting, or diarrhea. This information is not intended to replace advice given to you by your health care provider. Make sure you discuss any questions you have with your health care provider. Document Released: 05/28/2005 Document Revised: 04/12/2016 Document Reviewed: 10/19/2015 Elsevier Interactive Patient Education  2018 [employer]  Post Anesthesia Home Care Instructions  Activity: Get plenty of rest for the remainder of the day. A responsible individual must stay with you for 24 hours following the procedure.  For the next 24 hours, DO NOT: -Drive a car -Paediatric nurse -Drink alcoholic beverages -Take any medication unless instructed by your physician -Make any legal decisions or sign important papers.  Meals: Start with liquid foods such as gelatin or soup. Progress to regular foods as tolerated. Avoid greasy, spicy, heavy foods. If nausea and/or vomiting occur, drink only clear liquids until  the nausea and/or vomiting subsides. Call your physician if vomiting continues.  Special Instructions/Symptoms: Your throat may feel dry or sore from the anesthesia or the breathing tube placed in your throat during surgery. If this causes discomfort, gargle with warm salt water. The discomfort should disappear within 24 hours.  If you had a scopolamine patch placed behind your ear for the management of post- operative nausea and/or vomiting:  1. The medication in the patch is effective for 72 hours, after which it should be removed.  Wrap patch in a tissue and discard in the trash. Wash hands thoroughly with soap and water. 2. You may remove the patch earlier than 72 hours if you experience unpleasant side effects which may include dry mouth, dizziness or visual disturbances. 3. Avoid touching the patch. Wash your hands with soap and water after contact with the patch.

## 2018-07-14 NOTE — Anesthesia Preprocedure Evaluation (Signed)
Anesthesia Evaluation  Patient identified by MRN, date of birth, ID band Patient awake    Reviewed: Allergy & Precautions, NPO status , Patient's Chart, lab work & pertinent test results  History of Anesthesia Complications (+) PONV and history of anesthetic complications  Airway Mallampati: II  TM Distance: >3 FB Neck ROM: Full    Dental  (+) Teeth Intact, Dental Advisory Given   Pulmonary Current Smoker (+Smoked on DOS),    Pulmonary exam normal breath sounds clear to auscultation       Cardiovascular negative cardio ROS Normal cardiovascular exam Rhythm:Regular Rate:Normal     Neuro/Psych negative neurological ROS     GI/Hepatic negative GI ROS, Neg liver ROS,   Endo/Other  Hypothyroidism   Renal/GU negative Renal ROS     Musculoskeletal negative musculoskeletal ROS (+)   Abdominal   Peds  Hematology negative hematology ROS (+)   Anesthesia Other Findings Day of surgery medications reviewed with the patient.  Reproductive/Obstetrics                             Anesthesia Physical Anesthesia Plan  ASA: II  Anesthesia Plan: General   Post-op Pain Management:    Induction: Intravenous  PONV Risk Score and Plan: 4 or greater and Scopolamine patch - Pre-op, Midazolam, Dexamethasone, Ondansetron and Diphenhydramine  Airway Management Planned: Oral ETT  Additional Equipment:   Intra-op Plan:   Post-operative Plan: Extubation in OR  Informed Consent: I have reviewed the patients History and Physical, chart, labs and discussed the procedure including the risks, benefits and alternatives for the proposed anesthesia with the patient or authorized representative who has indicated his/her understanding and acceptance.   Dental advisory given  Plan Discussed with: CRNA  Anesthesia Plan Comments:         Anesthesia Quick Evaluation

## 2018-07-14 NOTE — Transfer of Care (Signed)
Immediate Anesthesia Transfer of Care Note  Patient: Paula Mayer  Procedure(s) Performed: LAPAROSCOPIC TUBAL LIGATION (Bilateral )  Patient Location: PACU  Anesthesia Type:General  Level of Consciousness: drowsy and patient cooperative  Airway & Oxygen Therapy: Patient Spontanous Breathing and Patient connected to nasal cannula oxygen  Post-op Assessment: Report given to RN and Post -op Vital signs reviewed and stable  Post vital signs: Reviewed and stable  Last Vitals:  Vitals Value Taken Time  BP 106/74 07/14/2018  9:48 AM  Temp 36.4 C 07/14/2018  9:48 AM  Pulse 83 07/14/2018  9:57 AM  Resp 21 07/14/2018  9:57 AM  SpO2 93 % 07/14/2018  9:57 AM  Vitals shown include unvalidated device data.  Last Pain:  Vitals:   07/14/18 0948  TempSrc:   PainSc: Asleep      Patients Stated Pain Goal: 4 (07/14/18 0740)  Complications: No apparent anesthesia complications

## 2018-07-14 NOTE — Anesthesia Procedure Notes (Signed)
Procedure Name: Intubation Date/Time: 07/14/2018 8:54 AM Performed by: Marny Lowensteinapozzi, Iven Earnhart W, CRNA Pre-anesthesia Checklist: Patient identified, Emergency Drugs available, Suction available and Patient being monitored Patient Re-evaluated:Patient Re-evaluated prior to induction Oxygen Delivery Method: Circle system utilized Preoxygenation: Pre-oxygenation with 100% oxygen Induction Type: IV induction Ventilation: Mask ventilation without difficulty Laryngoscope Size: Miller and 2 Grade View: Grade I Tube type: Oral Tube size: 7.0 mm Number of attempts: 1 Placement Confirmation: ETT inserted through vocal cords under direct vision,  positive ETCO2,  CO2 detector and breath sounds checked- equal and bilateral Secured at: 21 cm Tube secured with: Tape Dental Injury: Teeth and Oropharynx as per pre-operative assessment

## 2018-07-14 NOTE — Op Note (Addendum)
Preoperative diagnosis: Desires surgical sterility Postoperative diagnosis: Same Procedure: Laparoscopic bilateral salpingectomy Surgeon: Lavina Hammanodd Cyndal Kasson M.D. Anesthesia: Gen. Endotracheal tube Findings: She had a normal abdomen and pelvis with normal uterus tubes and ovaries Specimens: Bilateral fallopian tubes Estimated blood loss: Minimal Complications: None  Procedure in detail  The patient was taken to the operating room and placed in the dorsosupine position. General anesthesia was induced. Her legs were placed in mobile stirrups and her left arm was tucked to her side. Abdomen perineum and vagina were then prepped and draped in the usual sterile fashion, bladder drained with a red robinson catheter, a Hulka tenaculum was applied to the cervix for uterine manipulation. Infraumbilical skin was then infiltrated with quarter percent Marcaine and a 1 cm vertical incision was made. The veress needle was inserted into the peritoneal cavity and placement confirmed by the water drop test and an opening pressure of 8 mm of mercury. CO2 was insufflated to a pressure of 12 mm of mercury and the veress needle was removed. A 10/11 disposable trocar was then introduced with direct visualization with the laparoscope. A 5 mm port was then placed low in the midline also under direct visualization. Inspection revealed the above-mentioned findings with normal anatomy. The distal end of each tube was grasped and elevated. Using bipolar cautery I was able to free the distal end of each tube from the ovary and fulgurate across the mesosalpinx and a proximal portion of the fallopian tube. Scissors were then used to remove the fallopian tube. This is done bilaterally without difficulty. Both segments of tube were removed through the umbilical trocar. The 5 mm port was removed under direct visualization. All gas was allowed to deflate from the abdomen and the umbilical trocar was removed. One figure-of-eight suture of 0  Vicryl was placed in the umbilical incision. Skin incisions were then closed with interrupted subcuticular sutures of 4-0 Vicryl followed by Dermabond. The Hulka tenaculum was removed. The patient was taken down from stirrups. She was awakened in the operating room and taken to the recovery room in stable condition after tolerating the procedure well. Counts were correct and she had PAS hose on throughout the procedure.

## 2018-07-14 NOTE — Anesthesia Postprocedure Evaluation (Signed)
Anesthesia Post Note  Patient: Paula Mayer  Procedure(s) Performed: LAPAROSCOPIC BILATERAL SALPINGECTOMY (Bilateral )     Patient location during evaluation: PACU Anesthesia Type: General Level of consciousness: awake and alert, oriented and awake Pain management: pain level controlled Vital Signs Assessment: post-procedure vital signs reviewed and stable Respiratory status: spontaneous breathing, nonlabored ventilation and respiratory function stable Cardiovascular status: blood pressure returned to baseline and stable Postop Assessment: no apparent nausea or vomiting Anesthetic complications: no    Last Vitals:  Vitals:   07/14/18 1030 07/14/18 1125  BP: 106/77 (!) 94/59  Pulse: 76 64  Resp: 18 16  Temp:  36.6 C  SpO2: 94% 94%    Last Pain:  Vitals:   07/14/18 1125  TempSrc: Axillary  PainSc: 2                  Cecile HearingStephen Edward Turk

## 2018-07-17 ENCOUNTER — Encounter (HOSPITAL_BASED_OUTPATIENT_CLINIC_OR_DEPARTMENT_OTHER): Payer: Self-pay | Admitting: Obstetrics and Gynecology

## 2019-03-15 ENCOUNTER — Other Ambulatory Visit: Payer: Self-pay

## 2019-03-15 ENCOUNTER — Other Ambulatory Visit (INDEPENDENT_AMBULATORY_CARE_PROVIDER_SITE_OTHER): Payer: 59

## 2019-03-15 DIAGNOSIS — E038 Other specified hypothyroidism: Secondary | ICD-10-CM

## 2019-03-15 DIAGNOSIS — E7800 Pure hypercholesterolemia, unspecified: Secondary | ICD-10-CM | POA: Diagnosis not present

## 2019-03-15 LAB — LIPID PANEL
Cholesterol: 169 mg/dL (ref 0–200)
HDL: 47.3 mg/dL
LDL Cholesterol: 107 mg/dL — ABNORMAL HIGH (ref 0–99)
NonHDL: 121.3
Total CHOL/HDL Ratio: 4
Triglycerides: 73 mg/dL (ref 0.0–149.0)
VLDL: 14.6 mg/dL (ref 0.0–40.0)

## 2019-03-15 LAB — TSH: TSH: 0.65 u[IU]/mL (ref 0.35–4.50)

## 2019-03-15 LAB — T4, FREE: Free T4: 0.96 ng/dL (ref 0.60–1.60)

## 2019-03-21 ENCOUNTER — Other Ambulatory Visit: Payer: Self-pay

## 2019-03-21 ENCOUNTER — Ambulatory Visit: Payer: 59 | Admitting: Endocrinology

## 2019-03-21 ENCOUNTER — Encounter: Payer: Self-pay | Admitting: Endocrinology

## 2019-03-21 ENCOUNTER — Ambulatory Visit (INDEPENDENT_AMBULATORY_CARE_PROVIDER_SITE_OTHER): Payer: 59 | Admitting: Endocrinology

## 2019-03-21 DIAGNOSIS — E7800 Pure hypercholesterolemia, unspecified: Secondary | ICD-10-CM | POA: Diagnosis not present

## 2019-03-21 DIAGNOSIS — E038 Other specified hypothyroidism: Secondary | ICD-10-CM

## 2019-03-21 NOTE — Progress Notes (Signed)
Patient ID: Paula Mayer, female   DOB: 03/23/1976, 43 y.o.   MRN: [id number]   Today's office visit was provided via telemedicine using video technique Explained to the patient and the the limitations of evaluation and management by telemedicine and the availability of in person appointments.  The patient understood the limitations and agreed to proceed. Patient also understood that the telehealth visit is billable. . Location of the patient: Home . Location of the provider: Office Only the patient and myself were participating in the encounter    Reason for Appointment: Thyroid conditions, followup visit    History of Present Illness:   HYPOTHYROIDISM was first diagnosed  in 1994, following I-131 treatment for a hot nodules associated with subclinical hyperthyroidism.  Initially TSH was 10.1 Initially for a few years she had been on very high doses of thyroid supplement but her dose was progressively reduced later  She was taken off Synthroid because of negative TPO antibodies  but TSH  increased to 8.8 in 08/2012 and she had more fatigue; she was started back on Synthroid then She had been on a stable dose of 50 mcg Synthroid since about 2011   She does prefer the brand name medication, she thinks she feels jittery with generics  In 8/18 she was saying that she sometimes feels a little shaky but she thinks it was from her cutting out carbohydrates from her meals.   No other complaints suggestive of hyperthyroidism At that time her TSH was low at 0.04 without increased free T4 in 06/2017  Since 06/2017 she is taking only 25 g levothyroxine using brand name Synthroid  She is back for annual visit She feels fairly good with usually good energy level No feeling of jitteriness or palpitation Also no heat or cold intolerance She thinks she has gained weight from not watching her diet  She is now still euthyroid with a 25 mcg levothyroxine although her TSH is only  0.65  She is regular with taking her Synthroid before breakfast without any other supplements   Wt Readings from Last 3 Encounters:  07/14/18 126 lb 5 oz (57.3 kg)  03/20/18 121 lb 6.4 oz (55.1 kg)  09/20/17 125 lb 12.8 oz (57.1 kg)    Her thyroid levels have been stable:  Lab Results  Component Value Date   TSH 0.65 03/15/2019   TSH 1.15 03/16/2018   TSH 1.40 09/15/2017   FREET4 0.96 03/15/2019   FREET4 0.72 03/16/2018   FREET4 0.76 09/15/2017      THYROID nodule: She has had previous needle aspirations for her partially cystic thyroid NODULE several years ago  and this was benign.    She thinks the swelling in her neck is about the same  The ultrasound report from 2001 showed a mixed cystic/solid nodule on the right side measuring 3.9 cm in size Also left lobe is hypoplastic    Allergies as of 03/21/2019   No Known Allergies     Medication List       Accurate as of March 21, 2019  9:00 AM. Always use your most recent med list.        ibuprofen 200 MG tablet Commonly known as:  ADVIL Take 200 mg by mouth every 6 (six) hours as needed.   MULTIPLE VITAMIN PO Take 1 tablet by mouth daily.   Probiotic-10 Caps Take 1 capsule by mouth daily.   Synthroid 25 MCG tablet Generic drug:  levothyroxine TAKE 1 TABLET(25 MCG) BY MOUTH  DAILY BEFORE BREAKFAST        Past Medical History:  Diagnosis Date  . H/O gestational diabetes mellitus, not currently pregnant   . Hypothyroidism   . PONV (postoperative nausea and vomiting)     Past Surgical History:  Procedure Laterality Date  . BREAST ENHANCEMENT SURGERY  2015  . LAPAROSCOPIC BILATERAL SALPINGECTOMY Bilateral 07/14/2018   Procedure: LAPAROSCOPIC BILATERAL SALPINGECTOMY;  Surgeon: Lavina HammanMeisinger, Todd, MD;  [facility] [place];  Service: Gynecology;  Laterality: Bilateral;  . TONSILLECTOMY    . WISDOM TOOTH EXTRACTION      Family History  Problem Relation Age of Onset  . Diabetes Paternal  Grandmother   . CAD Father   . Cancer Father     Social History:  reports that she has been smoking cigarettes. She has never used smokeless tobacco. She reports current alcohol use. She reports that she does not use drugs.  Allergies: No Known Allergies   ROS:  She has had mild longstanding hyperlipidemia  She has not been watching her diet consistently and lipids are not as well-controlled  Lab Results  Component Value Date   CHOL 169 03/15/2019   CHOL 138 09/15/2017   CHOL 158 07/21/2015   Lab Results  Component Value Date   HDL 47.30 03/15/2019   HDL 52.10 09/15/2017   HDL 40.20 07/21/2015   Lab Results  Component Value Date   LDLCALC 107 (H) 03/15/2019   LDLCALC 74 09/15/2017   LDLCALC 104 (H) 07/21/2015   Lab Results  Component Value Date   TRIG 73.0 03/15/2019   TRIG 61.0 09/15/2017   TRIG 67.0 07/21/2015   Lab Results  Component Value Date   CHOLHDL 4 03/15/2019   CHOLHDL 3 09/15/2017   CHOLHDL 4 07/21/2015   No results found for: LDLDIRECT    She still does not have a PCP  SMOKING: She is smoking a pack a day, not motivated to quit   Examination:   There were no vitals taken for this visit.  She looks well      Assessments/PLAN:   1. Hypothyroidism, this has been mild presenting at age 43  With only 25 g she is still euthyroid Not clear if she is truly hypothyroid since her TSH is below 1.0 However in the past TSH had increased with a trial without Synthroid  Currently feels fairly good without any symptoms of hyper or hypothyroidism She will come back in 6 months for follow-up on the same dose Continue brand-name Synthroid which she prefers   2. Long-standing thyroid nodule to be examined on the next visit  3.  Hypercholesterolemia: Her LDL is gone up and discussed cutting back on high fat foods including dairy and animal products  Have mailed her a list of PCPs  Reather Littlerjay Vitaly Wanat 03/21/2019, 9:00 AM

## 2019-03-21 NOTE — Progress Notes (Signed)
Patient ID: Paula Mayer, female   DOB: 07-02-1976, 43 y.o.   MRN: [id number]   Reason for Appointment: Thyroid conditions, followup visit    History of Present Illness:   HYPOTHYROIDISM was first diagnosed  in 1994, following I-131 treatment for a hot nodules associated with subclinical hyperthyroidism.  Initially TSH was 10.1 Initially for a few years she had been on very high doses of thyroid supplement but her dose was progressively reduced later  She was taken off Synthroid because of negative TPO antibodies  but TSH  increased to 8.8 in 08/2012 and she had more fatigue; she was started back on Synthroid then She had been on a stable dose of 50 mcg Synthroid since about 2011   She does prefer the brand name medication, she thinks she feels jittery with generics  In 8/18 she was saying that she sometimes feels a little shaky but she thinks it was from her cutting out carbohydrates from her meals.   No other complaints suggestive of hyperthyroidism At that time her TSH was low at 0.04 without increased free T4 in 06/2017  Since then she is taking only 25 g levothyroxine using brand name Synthroid She feels like the energy level excellent Weight is slightly better, she tries to cut back on her portions and carbs  She is now still euthyroid with the small dose no change in TSH  Compliance with the thyroid medication has been  consistent with taking the tablet in the morning before breakfast and no other supplements at that time.    Wt Readings from Last 3 Encounters:  07/14/18 126 lb 5 oz (57.3 kg)  03/20/18 121 lb 6.4 oz (55.1 kg)  09/20/17 125 lb 12.8 oz (57.1 kg)    Her thyroid levels have been stable:  Lab Results  Component Value Date   TSH 0.65 03/15/2019   TSH 1.15 03/16/2018   TSH 1.40 09/15/2017   FREET4 0.96 03/15/2019   FREET4 0.72 03/16/2018   FREET4 0.76 09/15/2017      THYROID nodule: She has had previous needle aspirations for her  partially cystic thyroid NODULE several years ago  and this was benign.  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 Always use your most recent med list.        ibuprofen 200 MG tablet Commonly known as:  ADVIL Take 200 mg by mouth every 6 (six) hours as needed.   MULTIPLE VITAMIN PO Take 1 tablet by mouth daily.   Probiotic-10 Caps Take 1 capsule by mouth daily.   Synthroid 25 MCG tablet Generic drug:  levothyroxine TAKE 1 TABLET(25 MCG) BY MOUTH DAILY BEFORE BREAKFAST        Past Medical History:  Diagnosis Date  . H/O gestational diabetes mellitus, not currently pregnant   . Hypothyroidism   . PONV (postoperative nausea and vomiting)     Past Surgical History:  Procedure Laterality Date  . BREAST ENHANCEMENT SURGERY  2015  . LAPAROSCOPIC BILATERAL SALPINGECTOMY Bilateral 07/14/2018   Procedure: LAPAROSCOPIC BILATERAL SALPINGECTOMY;  Surgeon: Lavina HammanMeisinger, Todd, MD;  [facility] [place];  Service: Gynecology;  Laterality:  Bilateral;  . TONSILLECTOMY    . WISDOM TOOTH EXTRACTION      Family History  Problem Relation Age of Onset  . Diabetes Paternal Grandmother   . CAD Father   . Cancer Father     Social History:  reports that she has been smoking cigarettes. She has never used smokeless tobacco. She reports current alcohol use. She reports that she does not use drugs.  Allergies: No Known Allergies   ROS:  She was previously told to have hypercholesterolemia, LDL from 2 years ago was 104 She says she normally has only a total cholesterol checked with her work health screening in April She was told that her HDL was 49 and LDL was in the 70s   She is trying to eat a healthy diet with reducing red meat  Lab Results  Component Value Date   CHOL 169 03/15/2019   HDL 47.30 03/15/2019   LDLCALC 107 (H) 03/15/2019   TRIG 73.0 03/15/2019   CHOLHDL 4 03/15/2019     She still does not have a PCP  SMOKING: She is still smoking a pack a day and she thinks she may try to quit later on when she is not so much pressure at work    Examination:   There were no vitals taken for this visit.  She looks well          NECK:  she has a rounded smooth thyroid nodule in the isthmus and extending just below sternal notch, measuring about 3 cm.  This has a fleshy texture   Lateral lobes are not palpable and there are no cervical lymph nodes          Assessments/PLAN:   1. Hypothyroidism, this has been mild presenting at age 1  With only taking 25 g is actually feeling quite good and TSH is still very normal She takes brand-name Synthroid She is very compliant with taking this and agrees to continue this unchanged She will come back in here for follow-up  2. Long-standing thyroid nodule, previously benign and on ultrasound mixed cystic/ solid No change in size on exam 3.  Smoking cessation: Emphasized the need to start cutting back on her smoking and she will try to do this later this year she has less work pressure    Reather Littler 03/21/2019, 8:58 AM

## 2019-04-04 ENCOUNTER — Encounter: Payer: Self-pay | Admitting: Family Medicine

## 2019-04-04 ENCOUNTER — Other Ambulatory Visit: Payer: Self-pay

## 2019-04-04 ENCOUNTER — Ambulatory Visit (INDEPENDENT_AMBULATORY_CARE_PROVIDER_SITE_OTHER): Payer: 59 | Admitting: Family Medicine

## 2019-04-04 VITALS — Resp 12

## 2019-04-04 DIAGNOSIS — E890 Postprocedural hypothyroidism: Secondary | ICD-10-CM

## 2019-04-04 DIAGNOSIS — L298 Other pruritus: Secondary | ICD-10-CM

## 2019-04-04 MED ORDER — CETIRIZINE HCL 10 MG PO TABS: 10.0000 mg | ORAL_TABLET | Freq: Two times a day (BID) | ORAL | 0 refills | Status: DC

## 2019-04-04 MED ORDER — PREDNISONE 20 MG PO TABS: ORAL_TABLET | ORAL | 0 refills | Status: AC

## 2019-04-04 NOTE — Progress Notes (Signed)
Virtual Visit via Video Note   I connected with Ms Cope-Williams on 04/04/19 at 11:30 AM EDT by a video enabled telemedicine application and verified that I am speaking with the correct person using two identifiers.  Location patient: home Location provider:home office Persons participating in the virtual visit: patient, provider  I discussed the limitations of evaluation and management by telemedicine and the availability of in person appointments. The patient expressed understanding and agreed to proceed.   HPI: She is a 43 yo female establishing care today. She has history of post dilation hypothyroidism, she follows with Dr. Lucianne Muss. Currently she is on levothyroxine 25 mcg daily. Dx at age 13 after receiving iodine radiation therapy for a hot thyroid nodule.  Lab Results  Component Value Date   TSH 0.65 03/15/2019    Former PCP:N/A She follows annually with her gynecologist for her female preventive visits, reporting mammogram and Pap smear as up-to-date.  Today she is complaining about pruritic skin rash that is started on upper chest.  She denies any new medication, detergent, soap, or body product. No known insect bite or outdoor exposures to plants. No sick contact. No Hx of eczema or similar rash in the past.  OTC medication for this problem: Benadryl. Problem is getting worse. Rash is not affecting her face, neck, palms, or soles.  Pruritus does not interfere with sleep, it seems worse during the day. Rash is constant.   She denies oral lesions/edema,cough, wheezing, dyspnea, abdominal pain, nausea, or vomiting. Negative for recent URI. She denies fever, chills, sore throat, fatigue, or body aches.  She has history of allergy rhinitis, occasional nasal congestion and rhinorrhea no more than usual.   ROS: See pertinent positives and negatives per HPI.  Past Medical History:  Diagnosis Date  . H/O gestational diabetes mellitus, not currently pregnant   .  Hypothyroidism   . PONV (postoperative nausea and vomiting)     Past Surgical History:  Procedure Laterality Date  . BREAST ENHANCEMENT SURGERY  2015  . LAPAROSCOPIC BILATERAL SALPINGECTOMY Bilateral 07/14/2018   Procedure: LAPAROSCOPIC BILATERAL SALPINGECTOMY;  Surgeon: Lavina Hamman, MD;  Location: [facility];  Service: Gynecology;  Laterality: Bilateral;  . TONSILLECTOMY    . WISDOM TOOTH EXTRACTION      Family History  Problem Relation Age of Onset  . Diabetes Paternal Grandmother   . CAD Father   . Cancer Father     Social History   Socioeconomic History  . Marital status: Divorced    Spouse name: Not on file  . Number of children: Not on file  . Years of education: Not on file  . Highest education level: Not on file  Occupational History  . Not on file  Social Needs  . Financial resource strain: Not on file  . Food insecurity:    Worry: Not on file    Inability: Not on file  . Transportation needs:    Medical: Not on file    Non-medical: Not on file  Tobacco Use  . Smoking status: Current Some Day Smoker    Types: Cigarettes  . Smokeless tobacco: Never Used  Substance and Sexual Activity  . Alcohol use: Yes    Comment: socially  . Drug use: No  . Sexual activity: Not on file  Lifestyle  . Physical activity:    Days per week: Not on file    Minutes per session: Not on file  . Stress: Not on file  Relationships  . Social connections:  Talks on phone: Not on file    Gets together: Not on file    Attends religious service: Not on file    Active member of club or organization: Not on file    Attends meetings of clubs or organizations: Not on file    Relationship status: Not on file  . Intimate partner violence:    Fear of current or ex partner: Not on file    Emotionally abused: Not on file    Physically abused: Not on file    Forced sexual activity: Not on file  Other Topics Concern  . Not on file  Social History Narrative  .  Not on file      Current Outpatient Medications:  .  cetirizine (ZYRTEC) 10 MG tablet, Take 1 tablet (10 mg total) by mouth 2 (two) times daily for 15 days., Disp: 30 tablet, Rfl: 0 .  ibuprofen (ADVIL,MOTRIN) 200 MG tablet, Take 200 mg by mouth every 6 (six) hours as needed. , Disp: , Rfl:  .  MULTIPLE VITAMIN PO, Take 1 tablet by mouth daily., Disp: , Rfl:  .  predniSONE (DELTASONE) 20 MG tablet, 3 tabs for 3 days, 2 tabs for 3 days, 1 tabs for 3 days, and 1/2 tab for 3 days. Take tables together with breakfast., Disp: 20 tablet, Rfl: 0 .  Probiotic Product (PROBIOTIC-10) CAPS, Take 1 capsule by mouth daily., Disp: , Rfl:  .  SYNTHROID 25 MCG tablet, TAKE 1 TABLET(25 MCG) BY MOUTH DAILY BEFORE BREAKFAST, Disp: 30 tablet, Rfl: 10  EXAM:  VITALS per patient if applicable:Resp 12   GENERAL: alert, oriented, appears well and in no acute distress  HEENT: atraumatic, conjunctiva clear, no obvious facial abnormalities.  NECK: normal movements of the head and neck  LUNGS: on inspection no signs of respiratory distress, breathing rate appears normal, no obvious gross SOB, gasping or wheezing  CV: no obvious cyanosis  MS: moves all visible extremities without noticeable abnormality.  SKIN: Papular erythematous rash scattered on upper chest,abdomen,back,and thighs.Some lesions have small crusty area due to scratching. No vesicular lesions or hives.  PSYCH/NEURO: pleasant and cooperative, no obvious depression or anxiety, speech and thought processing grossly intact  ASSESSMENT AND PLAN:  Discussed the following assessment and plan:  Pruritic erythematous rash We discussed possible etiologies. History and findings on inspection do not suggest a serious problem. After discussion of some side effects, she agrees with trying oral prednisone taper.  Recommend taking it with breakfast. Zyrtec 10 mg twice daily for 10 to 14 days. If needed she can add Pepcid 20 mg twice daily. Instructed  about warning signs. If rash is not greatly improved in 10 to 14 days, dermatology referral will be considered.   Hypothyroidism following radioiodine therapy Problem is well controlled. Continue following with Dr. Lucianne MussKumar.     I discussed the assessment and treatment plan with the patient. She was provided an opportunity to ask questions and all were answered. The patient agreed with the plan and demonstrated an understanding of the instructions.   The patient was advised to call back or seek an in-person evaluation if the symptoms worsen or if the condition fails to improve as anticipated.  Return if symptoms worsen or fail to improve, for Needs CPE.    Neko Boyajian [place], MD

## 2019-04-04 NOTE — Assessment & Plan Note (Signed)
Problem is well controlled. Continue following with Dr. Kumar. 

## 2019-06-20 ENCOUNTER — Other Ambulatory Visit: Payer: Self-pay | Admitting: Endocrinology

## 2019-08-28 ENCOUNTER — Telehealth: Payer: Self-pay | Admitting: Endocrinology

## 2019-08-28 NOTE — Telephone Encounter (Signed)
Patient states at her last visit with Dr. Dwyane Dee he was concerned with her cholesterol being high and wanted to know at her next blood work if he wanted to get that done also.  Please Advise, Thanks

## 2019-08-29 ENCOUNTER — Other Ambulatory Visit: Payer: Self-pay | Admitting: Endocrinology

## 2019-08-29 NOTE — Telephone Encounter (Signed)
This is already ordered, orders are visible in the open orders and results section

## 2019-09-18 ENCOUNTER — Other Ambulatory Visit (INDEPENDENT_AMBULATORY_CARE_PROVIDER_SITE_OTHER): Payer: 59

## 2019-09-18 ENCOUNTER — Other Ambulatory Visit: Payer: Self-pay

## 2019-09-18 DIAGNOSIS — E038 Other specified hypothyroidism: Secondary | ICD-10-CM | POA: Diagnosis not present

## 2019-09-18 DIAGNOSIS — E7800 Pure hypercholesterolemia, unspecified: Secondary | ICD-10-CM

## 2019-09-18 LAB — LIPID PANEL
Cholesterol: 187 mg/dL (ref 0–200)
HDL: 50.4 mg/dL (ref >39.00)
LDL Cholesterol: 128 mg/dL — ABNORMAL HIGH (ref 0–99)
NonHDL: 136.51
Total CHOL/HDL Ratio: 4
Triglycerides: 45 mg/dL (ref 0.0–149.0)
VLDL: 9 mg/dL (ref 0.0–40.0)

## 2019-09-20 ENCOUNTER — Other Ambulatory Visit: Payer: Self-pay

## 2019-09-20 ENCOUNTER — Encounter: Payer: 59 | Admitting: Endocrinology

## 2019-09-20 ENCOUNTER — Encounter: Payer: Self-pay | Admitting: Endocrinology

## 2019-09-20 LAB — TSH: TSH: 0.71 u[IU]/mL (ref 0.35–4.50)

## 2019-09-20 LAB — T4, FREE: Free T4: 0.89 ng/dL (ref 0.60–1.60)

## 2019-09-21 ENCOUNTER — Ambulatory Visit (INDEPENDENT_AMBULATORY_CARE_PROVIDER_SITE_OTHER): Payer: 59 | Admitting: Endocrinology

## 2019-09-21 ENCOUNTER — Encounter: Payer: Self-pay | Admitting: Endocrinology

## 2019-09-21 DIAGNOSIS — E7800 Pure hypercholesterolemia, unspecified: Secondary | ICD-10-CM

## 2019-09-21 DIAGNOSIS — E038 Other specified hypothyroidism: Secondary | ICD-10-CM | POA: Diagnosis not present

## 2019-09-21 NOTE — Progress Notes (Signed)
Patient ID: Paula Mayer, female   DOB: August 16, 1976, 43 y.o.   MRN: [id number]   Today's office visit was provided via telemedicine using video technique Explained to the patient and the the limitations of evaluation and management by telemedicine and the availability of in person appointments.  The patient understood the limitations and agreed to proceed. Patient also understood that the telehealth visit is billable. . Location of the patient: Home . Location of the provider: Office Only the patient and myself were participating in the encounter    Reason for Appointment: Thyroid conditions, followup visit    History of Present Illness:   HYPOTHYROIDISM was first diagnosed  in 1994, following I-131 treatment for a hot nodules associated with subclinical hyperthyroidism.  Initially TSH was 10.1 Initially for a few years she had been on very high doses of thyroid supplement but her dose was progressively reduced later  She was taken off Synthroid because of negative TPO antibodies  but TSH  increased to 8.8 in 08/2012 and she had more fatigue; she was started back on Synthroid then She had been on a stable dose of 50 mcg Synthroid since about 2011   She does prefer the brand name medication, she thinks she feels jittery with generics  In 8/18 she was saying that she sometimes feels a little shaky but she thinks it was from her cutting out carbohydrates from her meals.   No other complaints suggestive of hyperthyroidism At that time her TSH was low at 0.04 without increased free T4 in 06/2017  Since 06/2017 she is taking only 25 g levothyroxine using brand name Synthroid  She is back for annual visit She feels fairly good with usually good energy level No feeling of jitteriness or palpitation Also no heat or cold intolerance She thinks she has gained weight from not watching her diet  She is now still euthyroid with a 25 mcg levothyroxine although her TSH is only  0.65  She is regular with taking her Synthroid before breakfast without any other supplements   Wt Readings from Last 3 Encounters:  07/14/18 126 lb 5 oz (57.3 kg)  03/20/18 121 lb 6.4 oz (55.1 kg)  09/20/17 125 lb 12.8 oz (57.1 kg)    Her thyroid levels have been stable:  Lab Results  Component Value Date   TSH 0.71 09/18/2019   TSH 0.65 03/15/2019   TSH 1.15 03/16/2018   FREET4 0.89 09/18/2019   FREET4 0.96 03/15/2019   FREET4 0.72 03/16/2018      THYROID nodule: She has had previous needle aspirations for her partially cystic thyroid NODULE several years ago  and this was benign.    She thinks the swelling in her neck is about the same  The ultrasound report from 2001 showed a mixed cystic/solid nodule on the right side measuring 3.9 cm in size Also left lobe is hypoplastic    Allergies as of 09/21/2019   No Known Allergies     Medication List       Accurate as of September 21, 2019  3:35 PM. If you have any questions, ask your nurse or doctor.        cetirizine 10 MG tablet Commonly known as: ZYRTEC Take 1 tablet (10 mg total) by mouth 2 (two) times daily for 15 days.   ibuprofen 200 MG tablet Commonly known as: ADVIL Take 200 mg by mouth every 6 (six) hours as needed.   MULTIPLE VITAMIN PO Take 1 tablet by mouth  daily.   Probiotic-10 Caps Take 1 capsule by mouth daily.   Synthroid 25 MCG tablet Generic drug: levothyroxine TAKE 1 TABLET(25 MCG) BY MOUTH DAILY BEFORE BREAKFAST        Past Medical History:  Diagnosis Date  . H/O gestational diabetes mellitus, not currently pregnant   . Hypothyroidism   . PONV (postoperative nausea and vomiting)     Past Surgical History:  Procedure Laterality Date  . BREAST ENHANCEMENT SURGERY  2015  . LAPAROSCOPIC BILATERAL SALPINGECTOMY Bilateral 07/14/2018   Procedure: LAPAROSCOPIC BILATERAL SALPINGECTOMY;  Surgeon: Lavina Hamman, MD;  Location: [facility] [place];  Service: Gynecology;   Laterality: Bilateral;  . TONSILLECTOMY    . WISDOM TOOTH EXTRACTION      Family History  Problem Relation Age of Onset  . Diabetes Paternal Grandmother   . CAD Father   . Cancer Father     Social History:  reports that she has been smoking cigarettes. She has never used smokeless tobacco. She reports current alcohol use. She reports that she does not use drugs.  Allergies: No Known Allergies   ROS:  She has had mild longstanding hyperlipidemia  She has not been watching her diet consistently and lipids are not as well-controlled  Lab Results  Component Value Date   CHOL 187 09/18/2019   CHOL 169 03/15/2019   CHOL 138 09/15/2017   Lab Results  Component Value Date   HDL 50.40 09/18/2019   HDL 47.30 03/15/2019   HDL 52.10 09/15/2017   Lab Results  Component Value Date   LDLCALC 128 (H) 09/18/2019   LDLCALC 107 (H) 03/15/2019   LDLCALC 74 09/15/2017   Lab Results  Component Value Date   TRIG 45.0 09/18/2019   TRIG 73.0 03/15/2019   TRIG 61.0 09/15/2017   Lab Results  Component Value Date   CHOLHDL 4 09/18/2019   CHOLHDL 4 03/15/2019   CHOLHDL 3 09/15/2017   No results found for: LDLDIRECT    She still does not have a PCP  SMOKING: She has started smoking about 2 months ago but is vaping.  She did this on her own after learning about the chemicals derived from burning tobacco   Examination:   There were no vitals taken for this visit.  She looks well  She has a moderately large thyroid nodule visible in the anterior neck and mostly in the midline and slightly to the right    Assessments/PLAN:   1. Hypothyroidism, this has been mild, presenting at age 43  She continues to be only on 25 g and she prefers brand name SYNTHROID  Subjectively doing well and even though her TSH has been low normal it is stable Previously in the past TSH had increased to about 8 with a trial without Synthroid  She agrees to continue her Synthroid and will stay on 25  mcg  She will come back in 6 months    2. Long-standing thyroid nodule to be examined on the next visit in 6 months  3.  Hypercholesterolemia: Her LDL is gone up further Discussed cutting back on high fat animal and dairy products, working on weight loss and increasing high-fiber diet and exercise  She will have repeat levels in 6 months  Sanaii Caporaso 09/21/2019, 3:35 PM

## 2019-09-22 ENCOUNTER — Encounter: Payer: Self-pay | Admitting: Endocrinology

## 2019-09-22 NOTE — Progress Notes (Signed)
This encounter was created in error - please disregard.

## 2019-10-20 ENCOUNTER — Other Ambulatory Visit: Payer: Self-pay | Admitting: Endocrinology

## 2020-02-01 ENCOUNTER — Other Ambulatory Visit: Payer: Self-pay

## 2020-02-01 ENCOUNTER — Telehealth: Payer: Self-pay | Admitting: Endocrinology

## 2020-02-01 MED ORDER — SYNTHROID 25 MCG PO TABS: ORAL_TABLET | ORAL | 0 refills | Status: DC

## 2020-02-01 NOTE — Telephone Encounter (Signed)
Rx sent 

## 2020-02-01 NOTE — Telephone Encounter (Signed)
MEDICATION: synthroid  PHARMACY:   [facility] [phone] - [place], [place] - [address] AT [facility] [address] Phone:  [phone]  Fax:  [phone]     IS THIS A 90 DAY SUPPLY : yes  IS PATIENT OUT OF MEDICATION: no  IF NOT; HOW MUCH IS LEFT: about 2-3 weeks  LAST APPOINTMENT DATE: 10/20/2019  NEXT APPOINTMENT DATE: 03/18/2020  DO WE HAVE YOUR PERMISSION TO LEAVE A DETAILED MESSAGE: yes  OTHER COMMENTS:    **Let patient know to contact pharmacy at the end of the day to make sure medication is ready. **  ** Please notify patient to allow 48-72 hours to process**  **Encourage patient to contact the pharmacy for refills or they can request refills through [facility]**

## 2020-03-18 ENCOUNTER — Other Ambulatory Visit: Payer: Self-pay

## 2020-03-18 ENCOUNTER — Other Ambulatory Visit (INDEPENDENT_AMBULATORY_CARE_PROVIDER_SITE_OTHER): Payer: 59

## 2020-03-18 DIAGNOSIS — E038 Other specified hypothyroidism: Secondary | ICD-10-CM

## 2020-03-18 DIAGNOSIS — E7800 Pure hypercholesterolemia, unspecified: Secondary | ICD-10-CM

## 2020-03-18 LAB — T4, FREE: Free T4: 0.88 ng/dL (ref 0.60–1.60)

## 2020-03-18 LAB — LIPID PANEL
Cholesterol: 154 mg/dL (ref 0–200)
HDL: 45.8 mg/dL (ref >39.00)
LDL Cholesterol: 89 mg/dL (ref 0–99)
NonHDL: 107.79
Total CHOL/HDL Ratio: 3
Triglycerides: 96 mg/dL (ref 0.0–149.0)
VLDL: 19.2 mg/dL (ref 0.0–40.0)

## 2020-03-18 LAB — TSH: TSH: 0.4 u[IU]/mL (ref 0.35–4.50)

## 2020-03-21 ENCOUNTER — Encounter: Payer: Self-pay | Admitting: Endocrinology

## 2020-03-21 ENCOUNTER — Telehealth (INDEPENDENT_AMBULATORY_CARE_PROVIDER_SITE_OTHER): Payer: 59 | Admitting: Endocrinology

## 2020-03-21 DIAGNOSIS — E038 Other specified hypothyroidism: Secondary | ICD-10-CM

## 2020-03-21 DIAGNOSIS — Z131 Encounter for screening for diabetes mellitus: Secondary | ICD-10-CM | POA: Diagnosis not present

## 2020-03-21 DIAGNOSIS — E7800 Pure hypercholesterolemia, unspecified: Secondary | ICD-10-CM

## 2020-03-21 NOTE — Progress Notes (Signed)
Patient ID: Paula Mayer, female   DOB: Aug 04, 1976, 44 y.o.   MRN: [id number]   I connected with the above-named patient by video enabled telemedicine application and verified that I am speaking with the correct person. The patient was explained the limitations of evaluation and management by telemedicine and the availability of in person appointments.  Patient also understood that there may be a patient responsible charge related to this service . Location of the patient: Patient's home . Location of the provider: Physician office Only the patient and myself were participating in the encounter The patient understood the above statements and agreed to proceed.   Reason for Appointment: Thyroid conditions, followup visit    History of Present Illness:   HYPOTHYROIDISM was first diagnosed  in 1994, following I-131 treatment for a hot nodules associated with subclinical hyperthyroidism.  Initially TSH was 10.1 Initially for a few years she had been on very high doses of thyroid supplement but her dose was progressively reduced later  She was taken off Synthroid because of negative TPO antibodies  but TSH  increased to 8.8 in 08/2012 and she had more fatigue; she was started back on Synthroid then She had been on a stable dose of 50 mcg Synthroid since about 2011   She does prefer the brand name medication, she thinks she feels jittery with generics  In 8/18 she was saying that she sometimes feels a little shaky but she thinks it was from her cutting out carbohydrates from her meals.   No other complaints suggestive of hyperthyroidism At that time her TSH was low at 0.04 without increased free T4 in 06/2017  Since 06/2017 she is taking only 25 g levothyroxine using brand name Synthroid  She was recommended a 25-month follow-up especially to recheck on her thyroid nodule but she was unable to come into the office today  Overall she has had no fatigue recently especially  with her improving her diet and quitting smoking No feeling of shakiness, anxiety or palpitations Her weight is recently about the same  TSH again is normal with 25 mcg levothyroxine although her TSH is only 0.40 without change in free T4  She is regular with taking her brand-name Synthroid before breakfast on empty stomach without any vitamins   Wt Readings from Last 3 Encounters:  07/14/18 126 lb 5 oz (57.3 kg)  03/20/18 121 lb 6.4 oz (55.1 kg)  09/20/17 125 lb 12.8 oz (57.1 kg)    Her thyroid levels as below  Lab Results  Component Value Date   TSH 0.40 03/18/2020   TSH 0.71 09/18/2019   TSH 0.65 03/15/2019   FREET4 0.88 03/18/2020   FREET4 0.89 09/18/2019   FREET4 0.96 03/15/2019      THYROID nodule: She has had previous needle aspirations for her partially cystic thyroid NODULE several years ago  and this was benign.    Subjectively she feels that the the swelling in her neck is about the same as before  The ultrasound report from 2001 showed a mixed cystic/solid nodule on the right side measuring 3.9 cm in size Also left lobe is hypoplastic    Allergies as of 03/21/2020   No Known Allergies     Medication List       Accurate as of March 21, 2020  9:09 AM. If you have any questions, ask your nurse or doctor.        STOP taking these medications   cetirizine 10 MG tablet Commonly  known as: ZYRTEC Stopped by: Reather Littler, MD     TAKE these medications   ibuprofen 200 MG tablet Commonly known as: ADVIL Take 200 mg by mouth every 6 (six) hours as needed.   MULTIPLE VITAMIN PO Take 1 tablet by mouth daily.   Probiotic-10 Caps Take 1 capsule by mouth daily.   Synthroid 25 MCG tablet Generic drug: levothyroxine Take 1 tablet by mouth once daily.        Past Medical History:  Diagnosis Date  . H/O gestational diabetes mellitus, not currently pregnant   . Hypothyroidism   . PONV (postoperative nausea and vomiting)     Past Surgical History:   Procedure Laterality Date  . BREAST ENHANCEMENT SURGERY  2015  . LAPAROSCOPIC BILATERAL SALPINGECTOMY Bilateral 07/14/2018   Procedure: LAPAROSCOPIC BILATERAL SALPINGECTOMY;  Surgeon: Lavina Hamman, MD;  Location: [facility];  Service: Gynecology;  Laterality: Bilateral;  . TONSILLECTOMY    . WISDOM TOOTH EXTRACTION      Family History  Problem Relation Age of Onset  . Diabetes Paternal Grandmother   . CAD Father   . Cancer Father     Social History:  reports that she has been smoking cigarettes. She has never used smokeless tobacco. She reports current alcohol use. She reports that she does not use drugs.  Allergies: No Known Allergies   ROS:  She has had mild longstanding hyperlipidemia  She has tried to do better with her diet and is cutting out red meat as well as eating more salads This is mostly in the last 2 weeks However her LDL is significantly better than usual and below 100 now  Lab Results  Component Value Date   CHOL 154 03/18/2020   CHOL 187 09/18/2019   CHOL 169 03/15/2019   Lab Results  Component Value Date   HDL 45.80 03/18/2020   HDL 50.40 09/18/2019   HDL 47.30 03/15/2019   Lab Results  Component Value Date   LDLCALC 89 03/18/2020   LDLCALC 128 (H) 09/18/2019   LDLCALC 107 (H) 03/15/2019   Lab Results  Component Value Date   TRIG 96.0 03/18/2020   TRIG 45.0 09/18/2019   TRIG 73.0 03/15/2019   Lab Results  Component Value Date   CHOLHDL 3 03/18/2020   CHOLHDL 4 09/18/2019   CHOLHDL 4 03/15/2019   No results found for: LDLDIRECT    SMOKING: He has continued to be off smoking cigarettes and is trying to cut back on her vaping also   Examination:   There were no vitals taken for this visit.       Assessments/PLAN:   1. Hypothyroidism, this has been mild, presenting at age 23  She is still is on 25 g supplement, she prefers brand name SYNTHROID  Although her TSH is low normal she is asymptomatic She does  have a previous history of hyperthyroidism  Since in the past her TSH had gone up significantly with stopping her Synthroid she will continue the same dose but come back in follow-up in 4 months She will let us know if she has any unusual fatigue or onset of palpitations or shakiness   2. Long-standing benign thyroid nodule to be examined on the next visit   3.  Hypercholesterolemia: Her LDL is excellent with improving her diet and she is motivated to continue this  Reather Littler 03/21/2020, 9:09 AM

## 2020-05-21 ENCOUNTER — Other Ambulatory Visit: Payer: Self-pay | Admitting: Endocrinology

## 2020-06-05 ENCOUNTER — Encounter: Payer: Self-pay | Admitting: Family Medicine

## 2020-06-05 ENCOUNTER — Other Ambulatory Visit: Payer: Self-pay

## 2020-06-05 ENCOUNTER — Ambulatory Visit: Payer: 59 | Admitting: Family Medicine

## 2020-06-05 VITALS — BP 124/62 | HR 97 | Temp 98.1°F | Wt 141.6 lb

## 2020-06-05 DIAGNOSIS — G8929 Other chronic pain: Secondary | ICD-10-CM | POA: Diagnosis not present

## 2020-06-05 DIAGNOSIS — M25511 Pain in right shoulder: Secondary | ICD-10-CM | POA: Diagnosis not present

## 2020-06-05 MED ORDER — METHYLPREDNISOLONE 4 MG PO TBPK: ORAL_TABLET | ORAL | 0 refills | Status: DC

## 2020-06-05 NOTE — Progress Notes (Signed)
   Subjective:    Patient ID: Paula Mayer, female    DOB: Nov 19, 1976, 44 y.o.   MRN: [id number]  HPI Here for 2 months of intermittent pains in the anterior right shoulder. No hx of trauma. 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 The pain does not radiate down the arm. No neck or back issues. Aleve or Advil do not help.    Review of Systems  Constitutional: Negative.   Respiratory: Negative.   Cardiovascular: Negative.   Gastrointestinal: Negative.   Musculoskeletal: Positive for arthralgias.       Objective:   Physical Exam Constitutional:      Appearance: Normal appearance.  Cardiovascular:     Rate and Rhythm: Normal rate and regular rhythm.     Pulses: Normal pulses.     Heart sounds: Normal heart sounds.  Pulmonary:     Effort: Pulmonary effort is normal.     Breath sounds: Normal breath sounds.  Musculoskeletal:     Comments: She is tender in the anterior right shoulder at the proximal insertion of the biceps tendon. No crepitus, full ROM   Neurological:     Mental Status: She is alert.           Assessment & Plan:  Biceps tendonitis. She will rest the arm as much as possible, apply ice 2-3 times a day, and take a Medrol dose pack. Recheck prn.  Gershon Crane, MD

## 2020-07-15 ENCOUNTER — Telehealth: Payer: Self-pay | Admitting: Family Medicine

## 2020-07-15 DIAGNOSIS — G8929 Other chronic pain: Secondary | ICD-10-CM

## 2020-07-15 NOTE — Telephone Encounter (Signed)
I did the referral to Orthopedics  

## 2020-07-15 NOTE — Telephone Encounter (Signed)
Left a detailed message on verified voice mail informing the patient that we have placed the referral and that their office will contact her within the next few days to get her scheduled.

## 2020-07-15 NOTE — Telephone Encounter (Signed)
Pt saw Dr. Clent Ridges on 07/15. Was prescribed some antibiotics and was told if her Rt shoulder didn't get any better to call the office to get a referral to see an orthopedic.   Please advise

## 2020-07-16 ENCOUNTER — Other Ambulatory Visit: Payer: Self-pay

## 2020-07-16 ENCOUNTER — Other Ambulatory Visit (INDEPENDENT_AMBULATORY_CARE_PROVIDER_SITE_OTHER): Payer: 59

## 2020-07-16 DIAGNOSIS — Z131 Encounter for screening for diabetes mellitus: Secondary | ICD-10-CM

## 2020-07-16 DIAGNOSIS — E7800 Pure hypercholesterolemia, unspecified: Secondary | ICD-10-CM | POA: Diagnosis not present

## 2020-07-16 DIAGNOSIS — E038 Other specified hypothyroidism: Secondary | ICD-10-CM | POA: Diagnosis not present

## 2020-07-16 LAB — GLUCOSE, RANDOM: Glucose, Bld: 113 mg/dL — ABNORMAL HIGH (ref 70–99)

## 2020-07-16 LAB — TSH: TSH: 0.1 u[IU]/mL — ABNORMAL LOW (ref 0.35–4.50)

## 2020-07-16 LAB — LIPID PANEL
Cholesterol: 149 mg/dL (ref 0–200)
HDL: 53.5 mg/dL (ref >39.00)
LDL Cholesterol: 82 mg/dL (ref 0–99)
NonHDL: 95.57
Total CHOL/HDL Ratio: 3
Triglycerides: 67 mg/dL (ref 0.0–149.0)
VLDL: 13.4 mg/dL (ref 0.0–40.0)

## 2020-07-16 LAB — T4, FREE: Free T4: 0.97 ng/dL (ref 0.60–1.60)

## 2020-07-16 LAB — T3, FREE: T3, Free: 4.4 pg/mL — ABNORMAL HIGH (ref 2.3–4.2)

## 2020-07-21 ENCOUNTER — Ambulatory Visit: Payer: 59 | Admitting: Endocrinology

## 2020-07-21 ENCOUNTER — Encounter: Payer: Self-pay | Admitting: Endocrinology

## 2020-07-21 ENCOUNTER — Other Ambulatory Visit: Payer: Self-pay

## 2020-07-21 VITALS — BP 110/60 | HR 78 | Ht 61.0 in | Wt 140.8 lb

## 2020-07-21 DIAGNOSIS — R7989 Other specified abnormal findings of blood chemistry: Secondary | ICD-10-CM | POA: Diagnosis not present

## 2020-07-21 DIAGNOSIS — E041 Nontoxic single thyroid nodule: Secondary | ICD-10-CM

## 2020-07-21 NOTE — Progress Notes (Signed)
Patient ID: Paula Mayer, female   DOB: 1976/09/09, 44 y.o.   MRN: [id number]    Reason for Appointment: Thyroid conditions, followup visit    History of Present Illness:   HYPOTHYROIDISM was first diagnosed  in 1994, following I-131 treatment for a hot nodules associated with subclinical hyperthyroidism.  Initially TSH was 10.1 Initially for a few years she had been on very high doses of thyroid supplement but her dose was progressively reduced later  She was taken off Synthroid because of negative TPO antibodies  but TSH  increased to 8.8 in 08/2012 and she had more fatigue; she was started back on Synthroid then She had been on a stable dose of 50 mcg Synthroid since about 2011   She does prefer the brand name medication, she thinks she feels jittery with generics  In 8/18 she was saying that she sometimes feels a little shaky but she thinks it was from her cutting out carbohydrates from her meals.   No other complaints suggestive of hyperthyroidism At that time her TSH was low at 0.04 without increased free T4 in 06/2017  Since 06/2017 she is taking only 25 g levothyroxine using brand name Synthroid  She thinks she is having a little more fatigue lately and may tend to take a nap during the day Not clear if she is having any difficulty with physical activities with slight shortness of breath When she lies down she may feel her heart rate going faster but usually gets better after she tries to relax No shakiness She thinks she has gained weight from working from home and not exercising  TSH which was low normal at 0.4 is now 0.1 without any increase in free T4  She is regular with taking her brand-name Synthroid before breakfast on empty stomach without any vitamins   Wt Readings from Last 3 Encounters:  07/21/20 140 lb 12.8 oz (63.9 kg)  06/05/20 141 lb 9.6 oz (64.2 kg)  07/14/18 126 lb 5 oz (57.3 kg)    Her thyroid levels as below  Lab Results    Component Value Date   TSH 0.10 (L) 07/16/2020   TSH 0.40 03/18/2020   TSH 0.71 09/18/2019   FREET4 0.97 07/16/2020   FREET4 0.88 03/18/2020   FREET4 0.89 09/18/2019      THYROID nodule: She has had previous needle aspirations for her partially cystic thyroid NODULE several years ago  and this was benign.    The ultrasound report from 2001 showed a mixed cystic/solid nodule on the right side measuring 3.9 cm in size Also left lobe is hypoplastic    Allergies as of 07/21/2020   No Known Allergies     Medication List       Accurate as of July 21, 2020  9:05 AM. If you have any questions, ask your nurse or doctor.        Aleve 220 MG Caps Generic drug: Naproxen Sodium Take by mouth as needed.   ibuprofen 200 MG tablet Commonly known as: ADVIL Take 200 mg by mouth every 6 (six) hours as needed.   methylPREDNISolone 4 MG Tbpk tablet Commonly known as: MEDROL DOSEPAK As directed   MULTIPLE VITAMIN PO Take 1 tablet by mouth daily.   Probiotic-10 Caps Take 1 capsule by mouth daily.   Synthroid 25 MCG tablet Generic drug: levothyroxine TAKE 1 TABLET BY MOUTH EVERY DAY        Past Medical History:  Diagnosis Date  . H/O gestational  diabetes mellitus, not currently pregnant   . Hypothyroidism   . PONV (postoperative nausea and vomiting)     Past Surgical History:  Procedure Laterality Date  . BREAST ENHANCEMENT SURGERY  2015  . LAPAROSCOPIC BILATERAL SALPINGECTOMY Bilateral 07/14/2018   Procedure: LAPAROSCOPIC BILATERAL SALPINGECTOMY;  Surgeon: Lavina Hamman, MD;  Location: [facility];  Service: Gynecology;  Laterality: Bilateral;  . TONSILLECTOMY    . WISDOM TOOTH EXTRACTION      Family History  Problem Relation Age of Onset  . Diabetes Paternal Grandmother   . CAD Father   . Cancer Father     Social History:  reports that she has been smoking cigarettes. She has never used smokeless tobacco. She reports current alcohol use. She  reports that she does not use drugs.  Allergies: No Known Allergies   ROS:  She has had mild longstanding hyperlipidemia  She has done well with cutting back on fatty foods and red meat Her LDL is significantly better than usual and further improved, previously over 100  Lab Results  Component Value Date   CHOL 149 07/16/2020   CHOL 154 03/18/2020   CHOL 187 09/18/2019   Lab Results  Component Value Date   HDL 53.50 07/16/2020   HDL 45.80 03/18/2020   HDL 50.40 09/18/2019   Lab Results  Component Value Date   LDLCALC 82 07/16/2020   LDLCALC 89 03/18/2020   LDLCALC 128 (H) 09/18/2019   Lab Results  Component Value Date   TRIG 67.0 07/16/2020   TRIG 96.0 03/18/2020   TRIG 45.0 09/18/2019   Lab Results  Component Value Date   CHOLHDL 3 07/16/2020   CHOLHDL 3 03/18/2020   CHOLHDL 4 09/18/2019   No results found for: LDLDIRECT     Examination:   BP 110/60 (BP Location: Left Arm, Patient Position: Sitting, Cuff Size: Normal)   Pulse 78   Ht 5\' 1"  (1.549 m)   Wt 140 lb 12.8 oz (63.9 kg)   SpO2 98%   BMI 26.60 kg/m   Has a smooth thyroid nodule in the isthmus which is relatively soft, visible on swallowing This measures about 2.5-3 cm Lateral part of the thyroid not palpable     Assessments/PLAN:   1. Hypothyroidism, this has been mild, presenting at age 44  She is currently on 25 g supplement, she prefers brand name SYNTHROID  She has mild fatigue but not clear if this is related to the change in her thyroid level  Unclear why her TSH is now suppressed She does have a previous history of hyperthyroidism reportedly from hot nodules  We will have her stop her levothyroxine and follow-up in 2 months, may well have some regrowth of the autonomous thyroid cells that she previously had   2. Long-standing benign thyroid nodule which is about the same size as before  3.  Hypercholesterolemia: Her LDL is excellent with improving her diet with reduced  saturated fats   15 07/21/2020, 9:05 AM

## 2020-09-24 ENCOUNTER — Other Ambulatory Visit (INDEPENDENT_AMBULATORY_CARE_PROVIDER_SITE_OTHER): Payer: 59

## 2020-09-24 ENCOUNTER — Other Ambulatory Visit: Payer: Self-pay

## 2020-09-24 DIAGNOSIS — E041 Nontoxic single thyroid nodule: Secondary | ICD-10-CM

## 2020-09-24 DIAGNOSIS — R7989 Other specified abnormal findings of blood chemistry: Secondary | ICD-10-CM

## 2020-09-24 LAB — T3, FREE: T3, Free: 3.5 pg/mL (ref 2.3–4.2)

## 2020-09-24 LAB — T4, FREE: Free T4: 0.63 ng/dL (ref 0.60–1.60)

## 2020-09-24 LAB — TSH: TSH: 1.87 u[IU]/mL (ref 0.35–4.50)

## 2020-09-29 NOTE — Progress Notes (Signed)
Patient ID: Paula Mayer, female   DOB: [birth year], 44 y.o.   MRN: [id number]    Reason for Appointment: Thyroid conditions, followup visit    History of Present Illness:   HYPOTHYROIDISM was first diagnosed  in 1994, following I-131 treatment for a hot nodules associated with subclinical hyperthyroidism.  Initially TSH was 10.1 Initially for a few years she had been on very high doses of thyroid supplement but her dose was progressively reduced later  She was taken off Synthroid because of negative TPO antibodies  but TSH  increased to 8.8 in 08/2012 and she had more fatigue; she was started back on Synthroid then She had been on a stable dose of 50 mcg Synthroid since about 2011  She does prefer the brand name medication, she thinks she feels jittery with generics  In 8/18 she was saying that she sometimes feels a little shaky but she thinks it was from her cutting out carbohydrates from her meals.   No other complaints suggestive of hyperthyroidism At that time her TSH was low at 0.04 without increased free T4 in 06/2017  Since 06/2017 she was taking only 25 g levothyroxine using brand name Synthroid  RECENT HISTORY:  On her last visit in 8/21 she was having a little more fatigue and needing to take a nap in the afternoon, also may have been feeling her heart beating faster at times Since her TSH was down to 0.1 with increased T3 level although without any increase in free T4 she was told to leave off her Synthroid  She has felt about the same since then and does not feel any increased fatigue, she may occasionally take a short nap in the afternoon No palpitations Weight is about the same   Wt Readings from Last 3 Encounters:  09/30/20 142 lb 9.6 oz (64.7 kg)  07/21/20 140 lb 12.8 oz (63.9 kg)  06/05/20 141 lb 9.6 oz (64.2 kg)    Her thyroid levels as below  Lab Results  Component Value Date   TSH 1.87 09/24/2020   TSH 0.10 (L) 07/16/2020   TSH 0.40  03/18/2020   FREET4 0.63 09/24/2020   FREET4 0.97 07/16/2020   FREET4 0.88 03/18/2020   Lab Results  Component Value Date   T3FREE 3.5 09/24/2020   T3FREE 4.4 (H) 07/16/2020   T3FREE 3.6 09/15/2017      THYROID nodule: She has had previous needle aspirations for her partially cystic thyroid NODULE several years ago  and this was benign.   The ultrasound report from 2001 showed a mixed cystic/solid nodule on the right side measuring 3.9 cm in size Also left lobe is hypoplastic    Allergies as of 09/30/2020   No Known Allergies     Medication List       Accurate as of September 30, 2020  8:08 AM. If you have any questions, ask your nurse or doctor.        Aleve 220 MG Caps Generic drug: Naproxen Sodium Take by mouth as needed.   ibuprofen 200 MG tablet Commonly known as: ADVIL Take 200 mg by mouth every 6 (six) hours as needed.   methylPREDNISolone 4 MG Tbpk tablet Commonly known as: MEDROL DOSEPAK As directed   MULTIPLE VITAMIN PO Take 1 tablet by mouth daily.   Probiotic-10 Caps Take 1 capsule by mouth daily.   Synthroid 25 MCG tablet Generic drug: levothyroxine TAKE 1 TABLET BY MOUTH EVERY DAY  Past Medical History:  Diagnosis Date  . H/O gestational diabetes mellitus, not currently pregnant   . Hypothyroidism   . PONV (postoperative nausea and vomiting)     Past Surgical History:  Procedure Laterality Date  . BREAST ENHANCEMENT SURGERY  2015  . LAPAROSCOPIC BILATERAL SALPINGECTOMY Bilateral 07/14/2018   Procedure: LAPAROSCOPIC BILATERAL SALPINGECTOMY;  Surgeon: Lavina Hamman, MD;  Location: [facility] [place];  Service: Gynecology;  Laterality: Bilateral;  . TONSILLECTOMY    . WISDOM TOOTH EXTRACTION      Family History  Problem Relation Age of Onset  . Diabetes Paternal Grandmother   . CAD Father   . Cancer Father     Social History:  reports that she has been smoking cigarettes. She has never used smokeless tobacco. She  reports current alcohol use. She reports that she does not use drugs.  Allergies: No Known Allergies   ROS:  She has had mild longstanding hyperlipidemia  She has done well with cutting back on fatty foods and red meat Her LDL is significantly better than usual and further improved, previously over 100  Lab Results  Component Value Date   CHOL 149 07/16/2020   CHOL 154 03/18/2020   CHOL 187 09/18/2019   Lab Results  Component Value Date   HDL 53.50 07/16/2020   HDL 45.80 03/18/2020   HDL 50.40 09/18/2019   Lab Results  Component Value Date   LDLCALC 82 07/16/2020   LDLCALC 89 03/18/2020   LDLCALC 128 (H) 09/18/2019   Lab Results  Component Value Date   TRIG 67.0 07/16/2020   TRIG 96.0 03/18/2020   TRIG 45.0 09/18/2019   Lab Results  Component Value Date   CHOLHDL 3 07/16/2020   CHOLHDL 3 03/18/2020   CHOLHDL 4 09/18/2019   No results found for: LDLDIRECT     Examination:   BP 110/64   Pulse 85   Ht 5\' 2"  (1.575 m)   Wt 142 lb 9.6 oz (64.7 kg)   SpO2 96%   BMI 26.08 kg/m   Thyroid nodule in the isthmus is relatively soft and easily visible, smooth Nodule is about 3 cm in size Rest of the thyroid not palpable No tremor     Assessments/PLAN:   1.  History of hypothyroidism, previously mild, presenting at age 8  Since her TSH was suppressed in 8/21 she has been taken off her Synthroid which was 25 mcg dose  Subjectively she is doing well and her thyroid levels are back to normal although free T4 is low normal She is not complaining of any unusual fatigue or weight change   2. Long-standing benign thyroid nodule which is about the same size as on her last measurement   9/21 09/30/2020, 8:08 AM

## 2020-09-30 ENCOUNTER — Ambulatory Visit: Payer: 59 | Admitting: Endocrinology

## 2020-09-30 ENCOUNTER — Other Ambulatory Visit: Payer: Self-pay

## 2020-09-30 ENCOUNTER — Encounter: Payer: Self-pay | Admitting: Endocrinology

## 2020-09-30 VITALS — BP 110/64 | HR 85 | Ht 62.0 in | Wt 142.6 lb

## 2020-09-30 DIAGNOSIS — E041 Nontoxic single thyroid nodule: Secondary | ICD-10-CM | POA: Diagnosis not present

## 2020-11-10 ENCOUNTER — Other Ambulatory Visit: Payer: Self-pay | Admitting: Obstetrics and Gynecology

## 2020-11-10 DIAGNOSIS — R928 Other abnormal and inconclusive findings on diagnostic imaging of breast: Secondary | ICD-10-CM

## 2020-11-26 ENCOUNTER — Other Ambulatory Visit: Payer: 59

## 2020-11-28 ENCOUNTER — Other Ambulatory Visit: Payer: Self-pay | Admitting: Obstetrics and Gynecology

## 2020-11-28 ENCOUNTER — Ambulatory Visit
Admission: RE | Admit: 2020-11-28 | Discharge: 2020-11-28 | Disposition: A | Discharge status: Home / Self Care | Payer: 59 | Source: Ambulatory Visit | Attending: Obstetrics and Gynecology | Admitting: Obstetrics and Gynecology

## 2020-11-28 ENCOUNTER — Other Ambulatory Visit: Payer: Self-pay

## 2020-11-28 DIAGNOSIS — R928 Other abnormal and inconclusive findings on diagnostic imaging of breast: Secondary | ICD-10-CM

## 2021-03-30 ENCOUNTER — Other Ambulatory Visit: Payer: Self-pay

## 2021-03-30 ENCOUNTER — Other Ambulatory Visit (INDEPENDENT_AMBULATORY_CARE_PROVIDER_SITE_OTHER): Payer: 59

## 2021-03-30 DIAGNOSIS — E041 Nontoxic single thyroid nodule: Secondary | ICD-10-CM | POA: Diagnosis not present

## 2021-03-30 LAB — TSH: TSH: 1.28 u[IU]/mL (ref 0.35–4.50)

## 2021-03-30 LAB — T4, FREE: Free T4: 0.65 ng/dL (ref 0.60–1.60)

## 2021-04-02 ENCOUNTER — Ambulatory Visit: Payer: 59 | Admitting: Endocrinology

## 2021-04-02 ENCOUNTER — Other Ambulatory Visit: Payer: Self-pay

## 2021-04-02 ENCOUNTER — Encounter: Payer: Self-pay | Admitting: Endocrinology

## 2021-04-02 VITALS — BP 98/62 | HR 79 | Ht 62.0 in | Wt 143.6 lb

## 2021-04-02 DIAGNOSIS — E041 Nontoxic single thyroid nodule: Secondary | ICD-10-CM | POA: Diagnosis not present

## 2021-04-02 NOTE — Progress Notes (Signed)
Patient ID: Paula Mayer, female   DOB: Apr 27, 1976, 45 y.o.   MRN: [id number]    Reason for Appointment: Thyroid conditions, followup visit    History of Present Illness:   HYPOTHYROIDISM was first diagnosed  in 1994, following I-131 treatment for a hot nodules associated with subclinical hyperthyroidism.  Initially TSH was 10.1 Initially for a few years she had been on very high doses of thyroid supplement but her dose was progressively reduced later  She was taken off Synthroid because of negative TPO antibodies  but TSH  increased to 8.8 in 08/2012 and she had more fatigue; she was started back on Synthroid then She had been on a stable dose of 50 mcg Synthroid since about 2011  She does prefer the brand name medication, she thinks she feels jittery with generics  In 8/18 she was saying that she sometimes feels a little shaky but she thinks it was from her cutting out carbohydrates from her meals.   No other complaints suggestive of hyperthyroidism At that time her TSH was low at 0.04 without increased free T4 in 06/2017  Since 06/2017 she was taking only 25 g levothyroxine using brand name Synthroid  RECENT HISTORY:  In 8/21 she was having a little more fatigue and needing to take a nap in the afternoon, also may have been feeling her heart beating faster at times Since her TSH was down to 0.1 with increased T3 level and normal free T4 she was told to leave off her Synthroid  She has felt about the same since her last visit Occasionally may have a little fatigue but she thinks taking no natural vitamin supplement is helping As before she may occasionally take a short nap in the afternoon Weight is about the same   Wt Readings from Last 3 Encounters:  04/02/21 143 lb 9.6 oz (65.1 kg)  09/30/20 142 lb 9.6 oz (64.7 kg)  07/21/20 140 lb 12.8 oz (63.9 kg)    Her thyroid levels as below  Lab Results  Component Value Date   TSH 1.28 03/30/2021   TSH 1.87  09/24/2020   TSH 0.10 (L) 07/16/2020   FREET4 0.65 03/30/2021   FREET4 0.63 09/24/2020   FREET4 0.97 07/16/2020   Lab Results  Component Value Date   T3FREE 3.5 09/24/2020   T3FREE 4.4 (H) 07/16/2020   T3FREE 3.6 09/15/2017      THYROID nodule: She has had likely 2 previous needle aspirations for her partially cystic thyroid NODULE several years ago, with benign results.   The ultrasound report from 2001 showed a mixed cystic/solid nodule on the right side measuring 3.9 cm in size Also left lobe is hypoplastic    Allergies as of 04/02/2021   No Known Allergies     Medication List       Accurate as of Apr 02, 2021  8:07 AM. If you have any questions, ask your nurse or doctor.        ibuprofen 200 MG tablet Commonly known as: ADVIL Take 200 mg by mouth every 6 (six) hours as needed.   MULTIPLE VITAMIN PO Take 1 tablet by mouth daily.   Naproxen Sodium 220 MG Caps Take by mouth as needed.   Probiotic-10 Caps Take 1 capsule by mouth daily.        Past Medical History:  Diagnosis Date  . H/O gestational diabetes mellitus, not currently pregnant   . Hypothyroidism   . PONV (postoperative nausea and vomiting)  Past Surgical History:  Procedure Laterality Date  . AUGMENTATION MAMMAPLASTY Bilateral   . BREAST BIOPSY Left 2016  . BREAST ENHANCEMENT SURGERY  2015  . LAPAROSCOPIC BILATERAL SALPINGECTOMY Bilateral 07/14/2018   Procedure: LAPAROSCOPIC BILATERAL SALPINGECTOMY;  Surgeon: Lavina Hamman, MD;  Location: [facility];  Service: Gynecology;  Laterality: Bilateral;  . TONSILLECTOMY    . WISDOM TOOTH EXTRACTION      Family History  Problem Relation Age of Onset  . Diabetes Paternal Grandmother   . CAD Father   . Cancer Father     Social History:  reports that she has been smoking cigarettes. She has never used smokeless tobacco. She reports current alcohol use. She reports that she does not use drugs.  Allergies: No Known  Allergies   ROS:  She has had mild longstanding hyperlipidemia  She has done well with cutting back on fatty foods and red meat Her LDL is significantly better than usual and further improved, previously over 100  Lab Results  Component Value Date   CHOL 149 07/16/2020   CHOL 154 03/18/2020   CHOL 187 09/18/2019   Lab Results  Component Value Date   HDL 53.50 07/16/2020   HDL 45.80 03/18/2020   HDL 50.40 09/18/2019   Lab Results  Component Value Date   LDLCALC 82 07/16/2020   LDLCALC 89 03/18/2020   LDLCALC 128 (H) 09/18/2019   Lab Results  Component Value Date   TRIG 67.0 07/16/2020   TRIG 96.0 03/18/2020   TRIG 45.0 09/18/2019   Lab Results  Component Value Date   CHOLHDL 3 07/16/2020   CHOLHDL 3 03/18/2020   CHOLHDL 4 09/18/2019   No results found for: LDLDIRECT     Examination:   BP 98/62   Pulse 79   Ht 5\' 2"  (1.575 m)   Wt 143 lb 9.6 oz (65.1 kg)   SpO2 98%   BMI 26.26 kg/m   Thyroid nodule in the isthmus is visible on inspection Nodule is about 3.5 cm in size and relatively soft and smooth Rest of the thyroid not palpable No lymphadenopathy in the neck     Assessments/PLAN:   1.  History of hypothyroidism, previously mild, presenting at age 45  Since her TSH was suppressed in 8/21 she has been taken off her 25 mcg Synthroid   She is not having any unusual or consistent fatigue now The free T4 is again low normal but TSH is quite normal now We will continue to watch without supplementation and she can come back in a year unless having new symptoms   2. Long-standing benign partially cystic thyroid nodule, clinically may be minimally larger since she has been off thyroid suppression Since she has had 2 biopsies in the past which were benign and no further evaluation needed unless there is a significant increase in size  3.  History of hypercholesterolemia: She can now follow-up with her PCP  9/21 04/02/2021, 8:07 AM

## 2021-07-24 IMAGING — MG MM DIGITAL DIAGNOSTIC UNILAT*L* W/ TOMO W/ CAD
4 series · 4 of 12 positions shown · non-contrast
Comparison: Prior films

CLINICAL DATA: Callback from screening mammogram for possible mass
left breast

EXAM:
DIGITAL DIAGNOSTIC left MAMMOGRAM WITH TOMO
ULTRASOUND left BREAST

[L CC synth-2D]
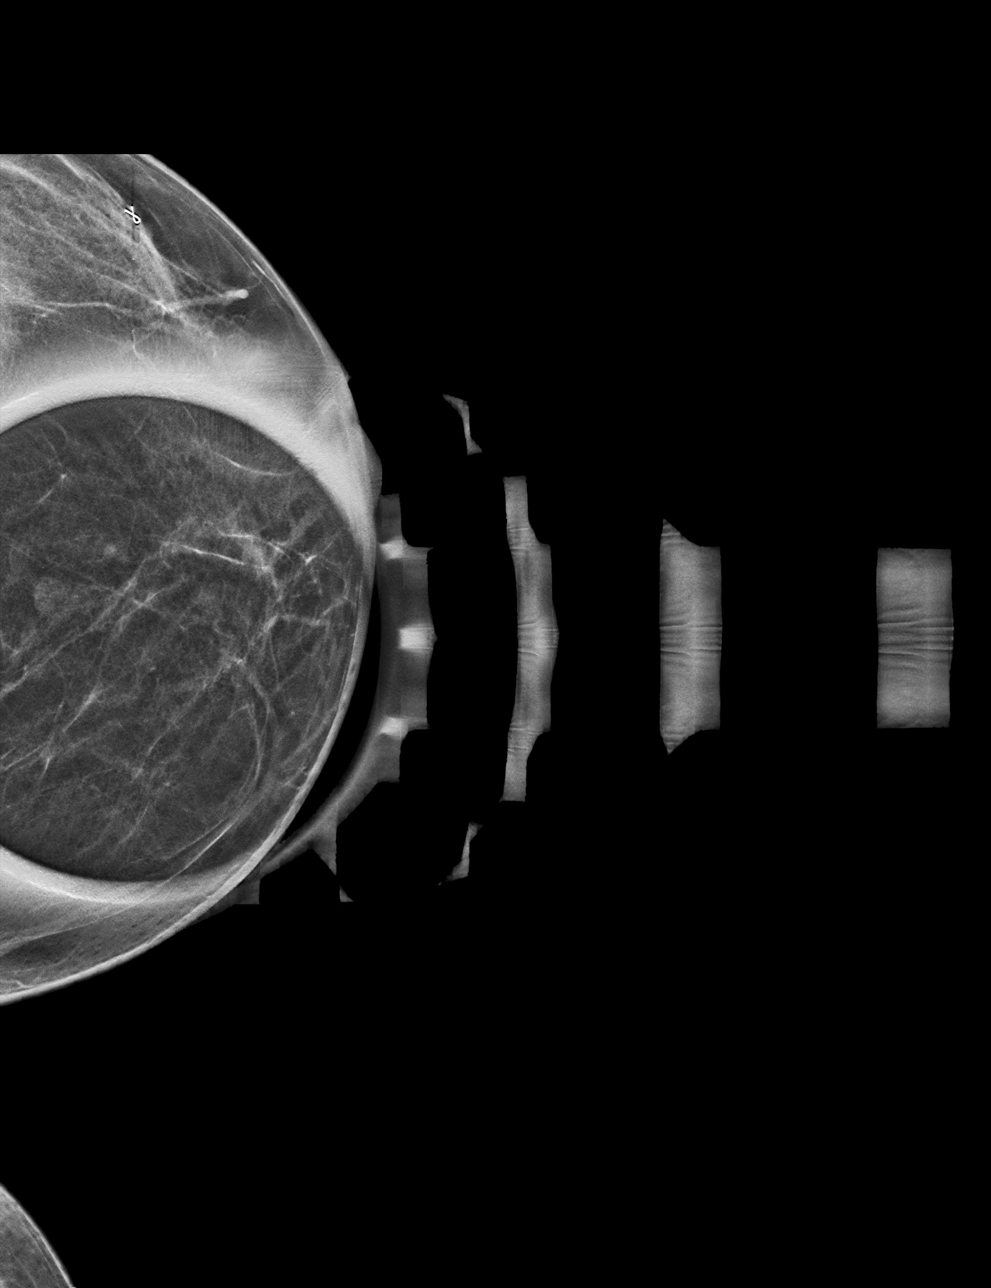

[L MLO synth-2D]
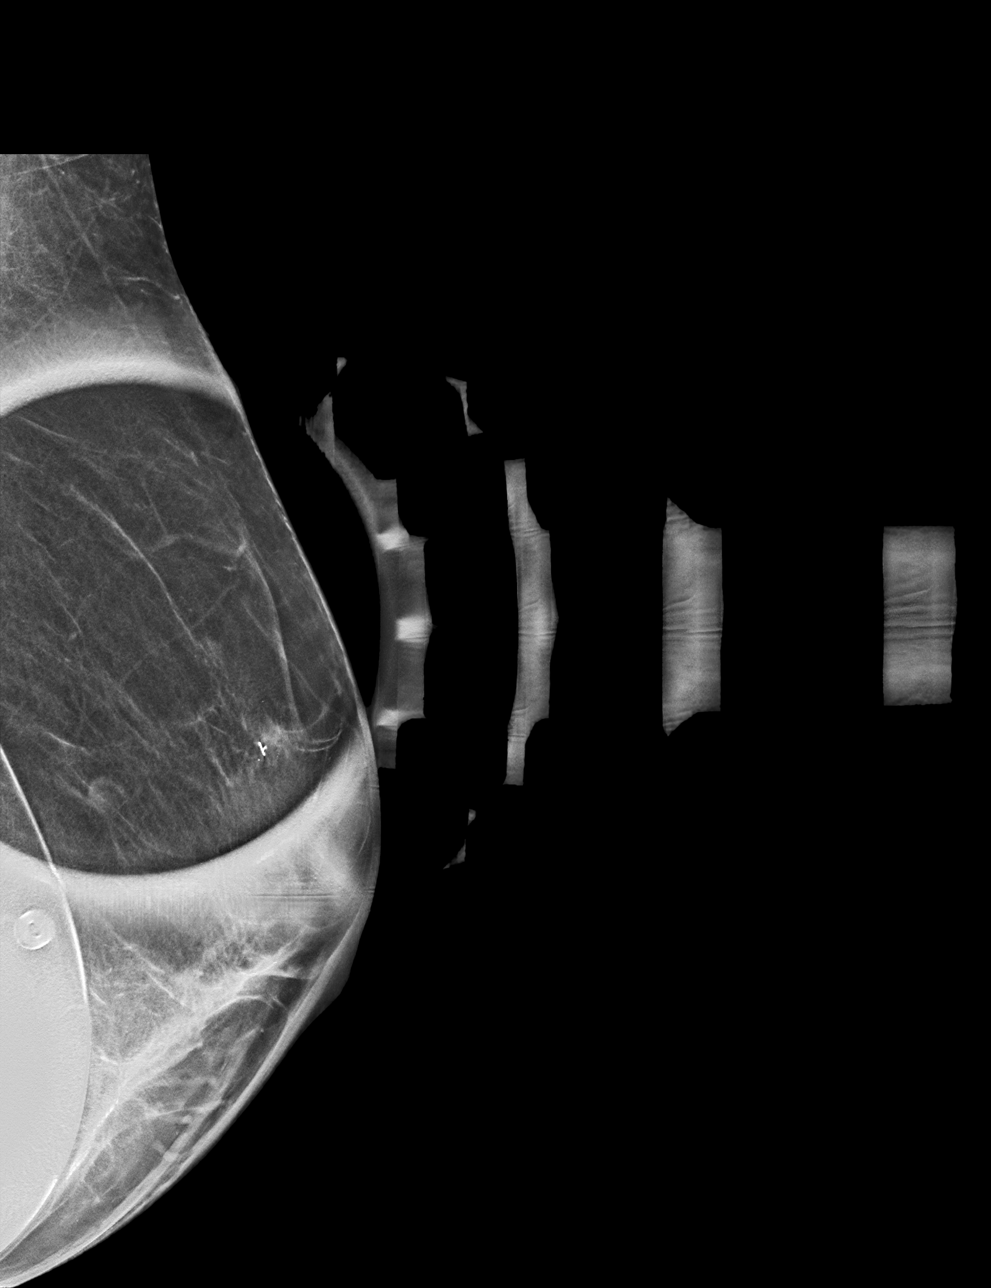

[L CC tomo · tomo slice 19/36.0]
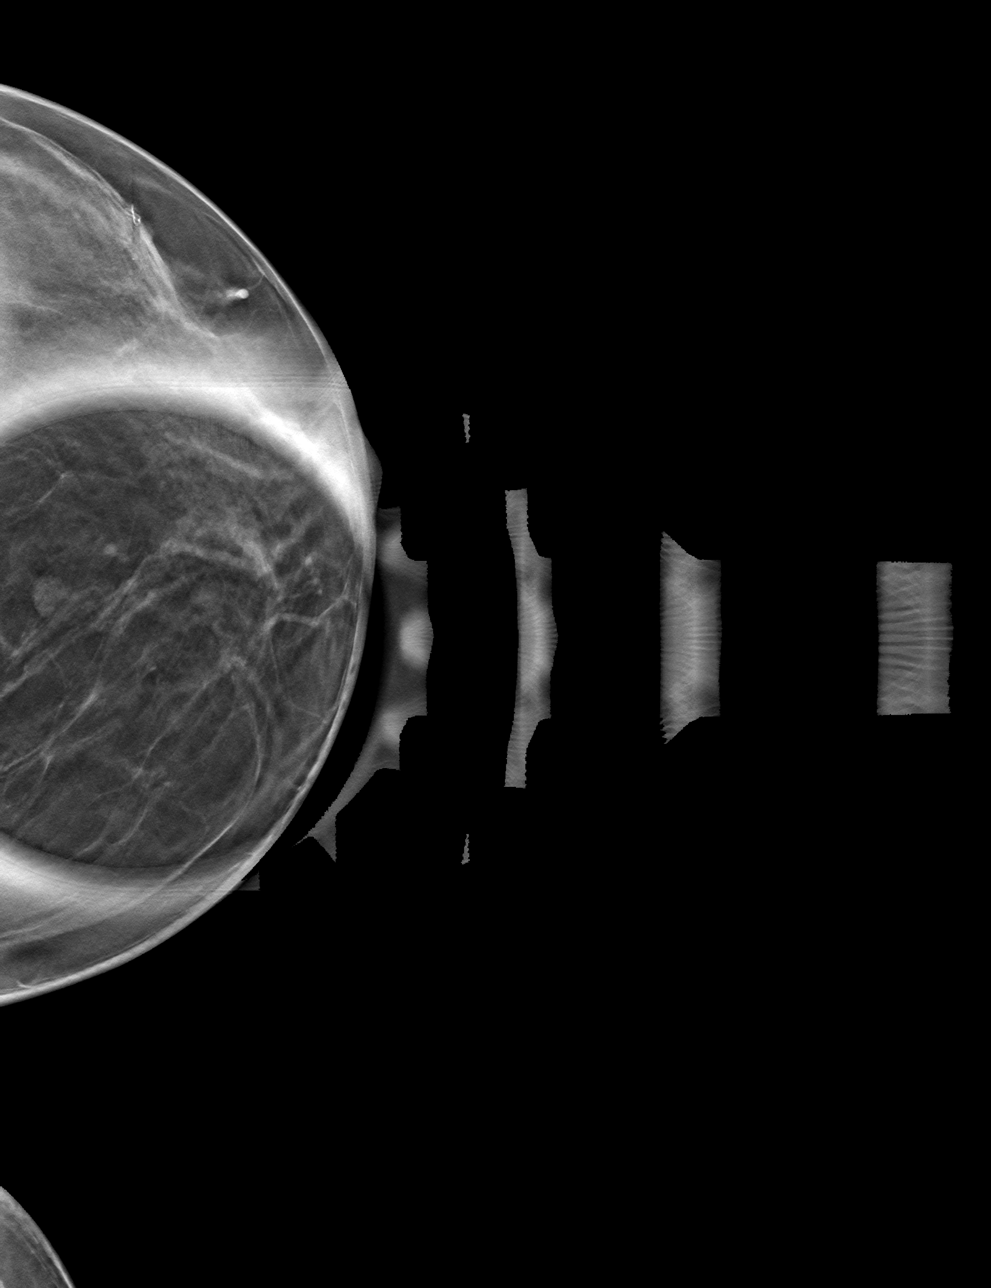

[L MLO tomo · tomo slice 21/42.0]
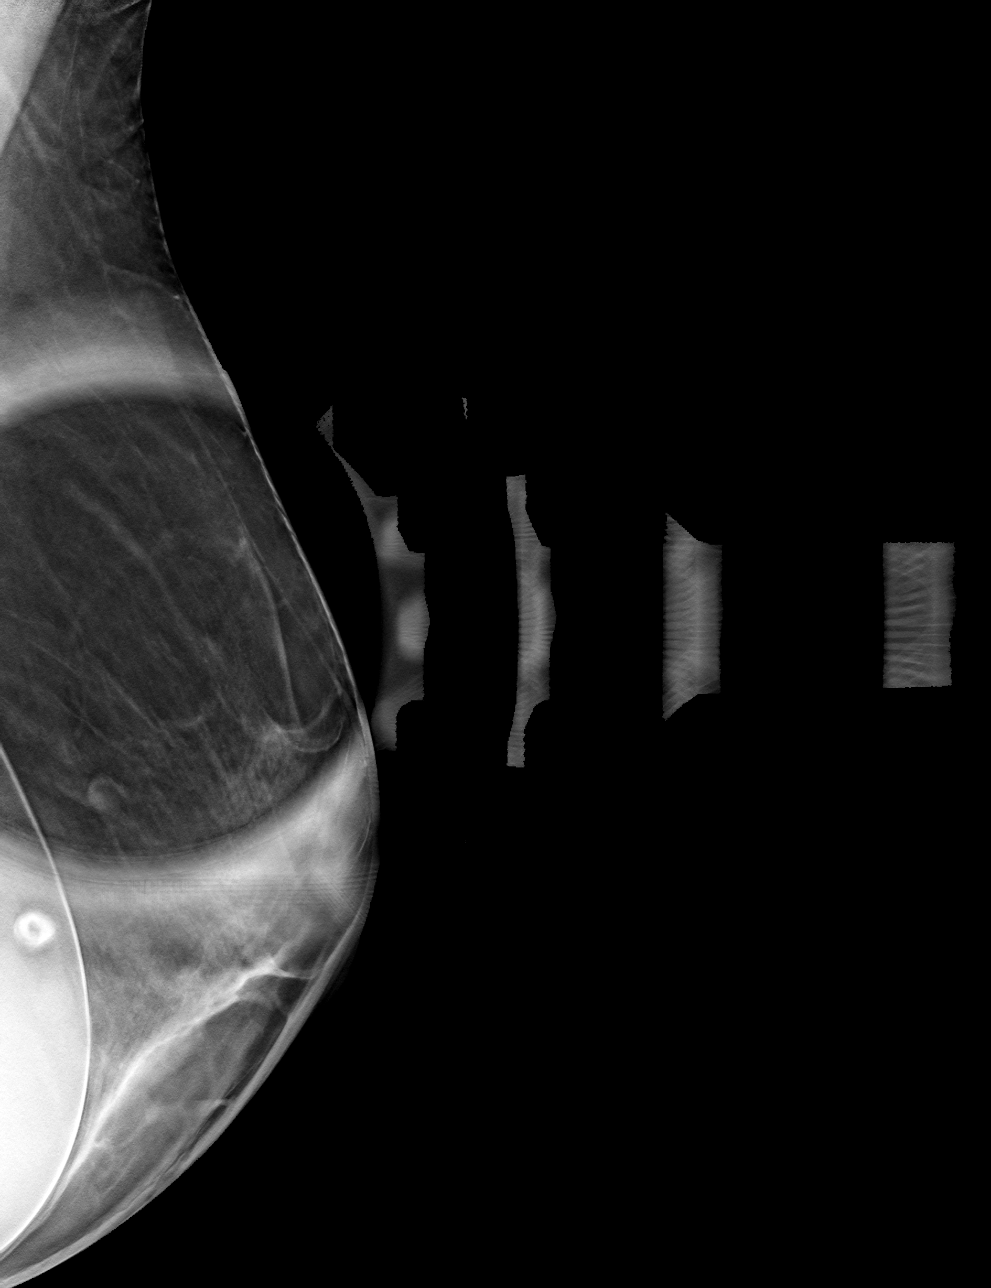

[4 of 12 positions shown; findings below may reference images not displayed]

ACR Breast Density Category b: There are scattered areas of
fibroglandular density.
FINDINGS: Bukhari spot compression cc and MLO views of the left breast are
submitted. Previously noted mass the slight upper medial left breast
has focal notched fat is probably due to intramammary lymph node.

Targeted ultrasound is performed, showing normal intramammary lymph
node at the left breast 10 o'clock retroareolar region measuring
0.52 cm likely correlating to the mammographic finding.
IMPRESSION: Benign findings.

RECOMMENDATION:
Routine screening mammogram back on schedule.

I have discussed the findings and recommendations with the patient.
If applicable, a reminder letter will be sent to the patient
regarding the next appointment.

BI-RADS CATEGORY  2: Benign.

## 2021-07-24 IMAGING — US US BREAST*L* LIMITED INC AXILLA
1 series · 4 of 4 positions shown · non-contrast
Comparison: Prior films

CLINICAL DATA: Callback from screening mammogram for possible mass
left breast

EXAM:
DIGITAL DIAGNOSTIC left MAMMOGRAM WITH TOMO
ULTRASOUND left BREAST

[Series 1: us breast*left* limited inc axilla · 0.05mm/px · 4 of 4 slices shown]
[im 1/4]
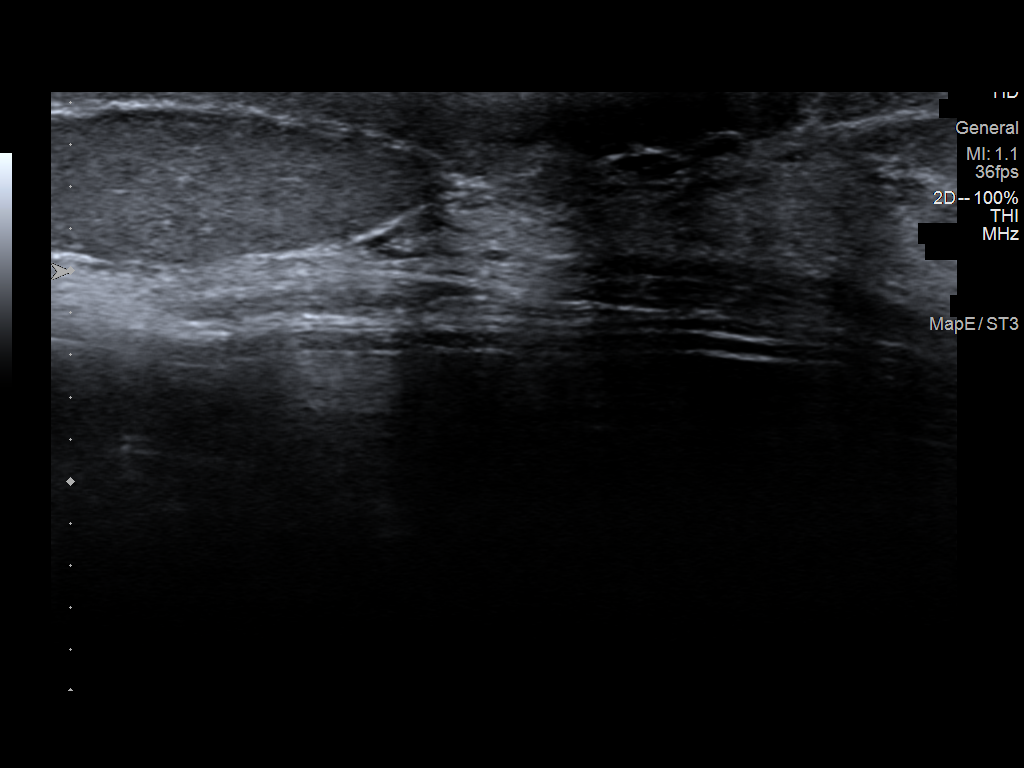
[im 2/4]
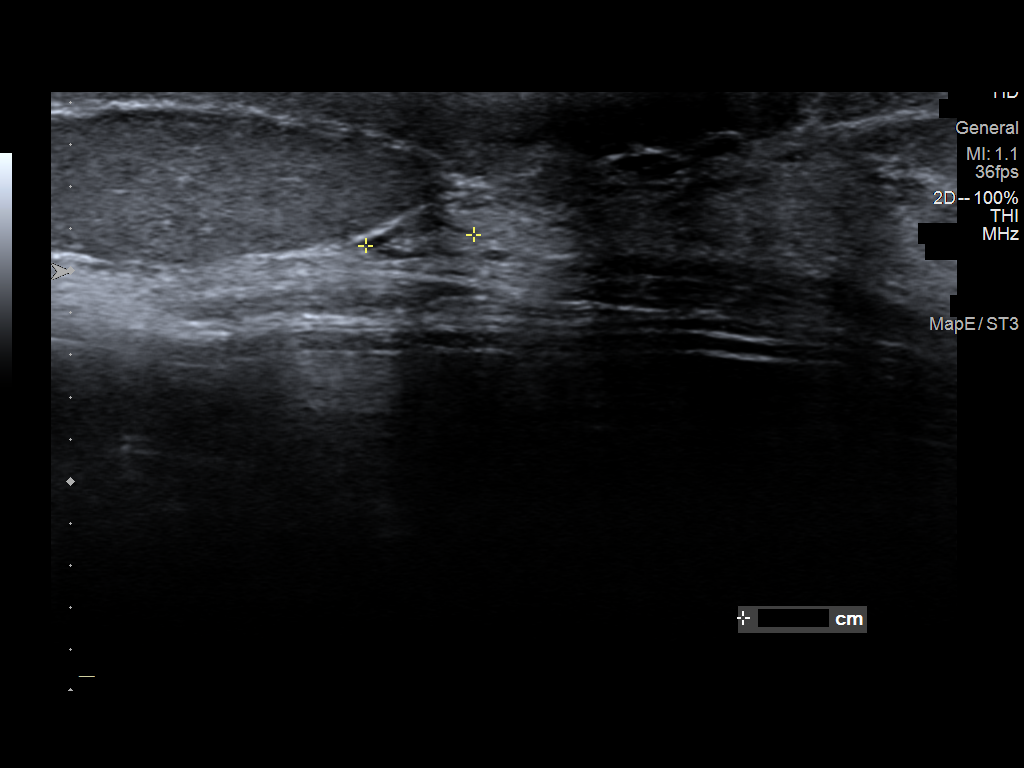
[im 3/4]
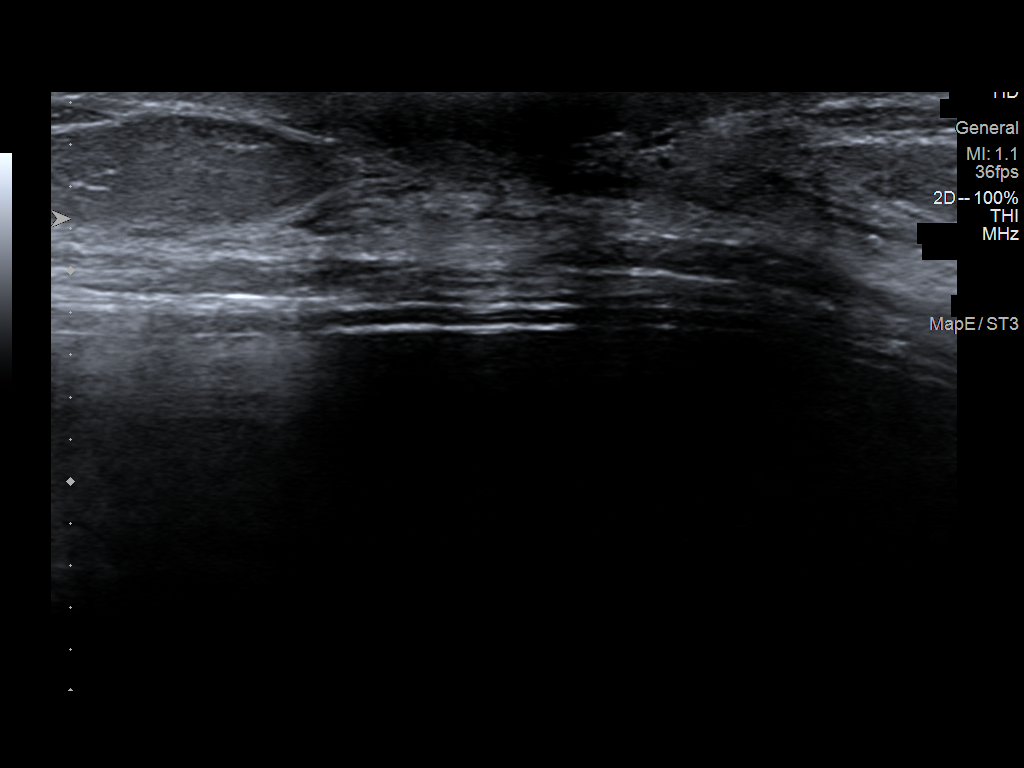
[im 4/4]
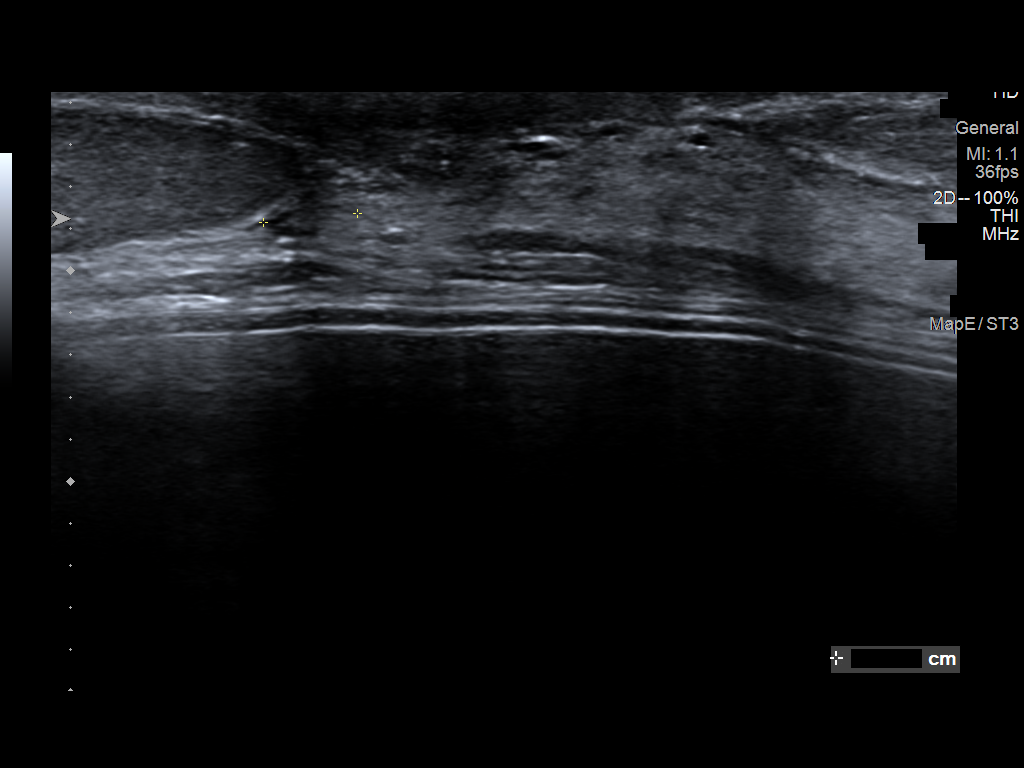

[4 of 4 positions shown; findings below may reference images not displayed]

ACR Breast Density Category b: There are scattered areas of
fibroglandular density.
FINDINGS: Bukhari spot compression cc and MLO views of the left breast are
submitted. Previously noted mass the slight upper medial left breast
has focal notched fat is probably due to intramammary lymph node.

Targeted ultrasound is performed, showing normal intramammary lymph
node at the left breast 10 o'clock retroareolar region measuring
0.52 cm likely correlating to the mammographic finding.
IMPRESSION: Benign findings.

RECOMMENDATION:
Routine screening mammogram back on schedule.

I have discussed the findings and recommendations with the patient.
If applicable, a reminder letter will be sent to the patient
regarding the next appointment.

BI-RADS CATEGORY  2: Benign.

## 2022-01-10 ENCOUNTER — Other Ambulatory Visit: Payer: Self-pay

## 2022-01-10 ENCOUNTER — Ambulatory Visit
Admission: EM | Admit: 2022-01-10 | Discharge: 2022-01-10 | Disposition: A | Discharge status: Home / Self Care | Payer: 59 | Attending: Urgent Care | Admitting: Urgent Care

## 2022-01-10 ENCOUNTER — Encounter: Payer: Self-pay | Admitting: Emergency Medicine

## 2022-01-10 DIAGNOSIS — J3489 Other specified disorders of nose and nasal sinuses: Secondary | ICD-10-CM

## 2022-01-10 DIAGNOSIS — J019 Acute sinusitis, unspecified: Secondary | ICD-10-CM

## 2022-01-10 DIAGNOSIS — R052 Subacute cough: Secondary | ICD-10-CM

## 2022-01-10 DIAGNOSIS — Z720 Tobacco use: Secondary | ICD-10-CM

## 2022-01-10 MED ORDER — LEVOCETIRIZINE DIHYDROCHLORIDE 5 MG PO TABS: 5.0000 mg | ORAL_TABLET | Freq: Every evening | ORAL | 0 refills | Status: AC

## 2022-01-10 MED ORDER — AMOXICILLIN 875 MG PO TABS: 875.0000 mg | ORAL_TABLET | Freq: Two times a day (BID) | ORAL | 0 refills | Status: DC

## 2022-01-10 MED ORDER — PSEUDOEPHEDRINE HCL 60 MG PO TABS: 60.0000 mg | ORAL_TABLET | Freq: Three times a day (TID) | ORAL | 0 refills | Status: AC | PRN

## 2022-01-10 NOTE — ED Triage Notes (Signed)
Sinus congestion and pain.  Top teeth hurt.  States she has started to cough up sputum over the past few days.  Has been taking mucinex to treat symptoms.  Symptoms x 1 week.  Home covid tests were negative.

## 2022-01-10 NOTE — ED Provider Notes (Signed)
[facility]   MRN: [id number] DOB: 07/07/1976  Subjective:   Paula Mayer is a 46 y.o. female presenting for 1+ week history of persistent worsening sinus congestion, sinus pressure, sinus pain, dental pain and bilateral ear pain.  She is also been coughing.  Has gotten some relief from using NyQuil and Mucinex.  Denies chest pain, shortness of breath or wheezing.  She was a former smoker, is currently vaping to quit nicotine altogether.  She did multiple COVID test and all were negative.  No current facility-administered medications for this encounter.  Current Outpatient Medications:    ibuprofen (ADVIL,MOTRIN) 200 MG tablet, Take 200 mg by mouth every 6 (six) hours as needed. , Disp: , Rfl:    MULTIPLE VITAMIN PO, Take 1 tablet by mouth daily., Disp: , Rfl:    Naproxen Sodium 220 MG CAPS, Take by mouth as needed., Disp: , Rfl:    Probiotic Product (PROBIOTIC-10) CAPS, Take 1 capsule by mouth daily., Disp: , Rfl:    No Known Allergies  Past Medical History:  Diagnosis Date   H/O gestational diabetes mellitus, not currently pregnant    Hypothyroidism    PONV (postoperative nausea and vomiting)      Past Surgical History:  Procedure Laterality Date   AUGMENTATION MAMMAPLASTY Bilateral    BREAST BIOPSY Left 2016   BREAST ENHANCEMENT SURGERY  2015   LAPAROSCOPIC BILATERAL SALPINGECTOMY Bilateral 07/14/2018   Procedure: LAPAROSCOPIC BILATERAL SALPINGECTOMY;  Surgeon: Lavina Hamman, MD;  Location: [facility];  Service: Gynecology;  Laterality: Bilateral;   TONSILLECTOMY     WISDOM TOOTH EXTRACTION      Family History  Problem Relation Age of Onset   Diabetes Paternal Grandmother    CAD Father    Cancer Father     Social History   Tobacco Use   Smoking status: Some Days    Types: Cigarettes   Smokeless tobacco: Never  Substance Use Topics   Alcohol use: Yes    Comment: socially   Drug use: No    ROS   Objective:    Vitals: BP (!) 138/93 (BP Location: Right Arm)    Pulse 93    Temp 99.1 F (37.3 C) (Oral)    Resp 18    SpO2 94%   Physical Exam Constitutional:      General: She is not in acute distress.    Appearance: Normal appearance. She is well-developed and normal weight. She is not ill-appearing, toxic-appearing or diaphoretic.  HENT:     Head: Normocephalic and atraumatic.     Right Ear: Ear canal and external ear normal. No drainage or tenderness. No middle ear effusion. There is no impacted cerumen. Tympanic membrane is not erythematous.     Left Ear: Ear canal and external ear normal. No drainage or tenderness.  No middle ear effusion. There is no impacted cerumen. Tympanic membrane is not erythematous.     Nose: Congestion and rhinorrhea present.     Mouth/Throat:     Mouth: Mucous membranes are moist. No oral lesions.     Pharynx: No pharyngeal swelling, oropharyngeal exudate, posterior oropharyngeal erythema or uvula swelling.     Tonsils: No tonsillar exudate or tonsillar abscesses.     Comments: Hoarseness of voice.  Eyes:     General: No scleral icterus.       Right eye: No discharge.        Left eye: No discharge.     Extraocular Movements: Extraocular movements intact.  Right eye: Normal extraocular motion.     Left eye: Normal extraocular motion.     Conjunctiva/sclera: Conjunctivae normal.  Cardiovascular:     Rate and Rhythm: Normal rate.     Heart sounds: No murmur heard.   No friction rub. No gallop.  Pulmonary:     Effort: Pulmonary effort is normal. No respiratory distress.     Breath sounds: No stridor. No wheezing, rhonchi or rales.  Chest:     Chest wall: No tenderness.  Musculoskeletal:     Cervical back: Normal range of motion and neck supple.  Lymphadenopathy:     Cervical: No cervical adenopathy.  Skin:    General: Skin is warm and dry.  Neurological:     General: No focal deficit present.     Mental Status: She is alert and oriented to person,  place, and time.  Psychiatric:        Mood and Affect: Mood normal.        Behavior: Behavior normal.    Assessment and Plan :   PDMP not reviewed this encounter.  1. Acute non-recurrent sinusitis, unspecified location   2. Sinus pain   3. Subacute cough    Will start empiric treatment for sinusitis with amoxicillin.  Recommended supportive care otherwise including the use of oral antihistamine, decongestant. Deferred imaging given clear cardiopulmonary exam, hemodynamically stable vital signs.  Encouraged patient to continue efforts at quitting nicotine whether through cigarettes or vaping.  Counseled patient on potential for adverse effects with medications prescribed/recommended today, ER and return-to-clinic precautions discussed, patient verbalized understanding.    Wallis Bamberg, [place] 01/10/22 [phone]

## 2022-04-05 ENCOUNTER — Other Ambulatory Visit: Payer: Self-pay | Admitting: Endocrinology

## 2022-04-05 ENCOUNTER — Other Ambulatory Visit (INDEPENDENT_AMBULATORY_CARE_PROVIDER_SITE_OTHER): Payer: 59

## 2022-04-05 DIAGNOSIS — E7800 Pure hypercholesterolemia, unspecified: Secondary | ICD-10-CM

## 2022-04-05 DIAGNOSIS — E041 Nontoxic single thyroid nodule: Secondary | ICD-10-CM | POA: Diagnosis not present

## 2022-04-06 LAB — TSH: TSH: 1.16 u[IU]/mL (ref 0.35–5.50)

## 2022-04-06 LAB — LIPID PANEL
Cholesterol: 183 mg/dL (ref 0–200)
HDL: 61.7 mg/dL (ref >39.00)
LDL Cholesterol: 106 mg/dL — ABNORMAL HIGH (ref 0–99)
NonHDL: 120.82
Total CHOL/HDL Ratio: 3
Triglycerides: 72 mg/dL (ref 0.0–149.0)
VLDL: 14.4 mg/dL (ref 0.0–40.0)

## 2022-04-06 LAB — T4, FREE: Free T4: 0.7 ng/dL (ref 0.60–1.60)

## 2022-04-08 ENCOUNTER — Ambulatory Visit: Payer: 59 | Admitting: Endocrinology

## 2022-04-08 ENCOUNTER — Encounter: Payer: Self-pay | Admitting: Endocrinology

## 2022-04-08 VITALS — BP 120/80 | HR 77 | Ht 62.0 in | Wt 155.8 lb

## 2022-04-08 DIAGNOSIS — E041 Nontoxic single thyroid nodule: Secondary | ICD-10-CM | POA: Diagnosis not present

## 2022-04-08 DIAGNOSIS — E7800 Pure hypercholesterolemia, unspecified: Secondary | ICD-10-CM | POA: Diagnosis not present

## 2022-04-08 DIAGNOSIS — R5383 Other fatigue: Secondary | ICD-10-CM

## 2022-04-08 NOTE — Progress Notes (Signed)
Patient ID: Paula Mayer, female   DOB: [birth year], 46 y.o.   MRN: [id number]    Reason for Appointment: Thyroid conditions, followup visit    History of Present Illness:   HYPOTHYROIDISM was first diagnosed  in 1994, following I-131 treatment for a hot nodules associated with subclinical hyperthyroidism.  Initially TSH was 10.1 Initially for a few years she had been on very high doses of thyroid supplement but her dose was progressively reduced later  She was taken off Synthroid because of negative TPO antibodies  but TSH  increased to 8.8 in 08/2012 and she had more fatigue; she was started back on Synthroid then She had been on a stable dose of 50 mcg Synthroid since about 2011  She does prefer the brand name medication, she thinks she feels jittery with generics  In 8/18 she was saying that she sometimes feels a little shaky but she thinks it was from her cutting out carbohydrates from her meals.   No other complaints suggestive of hyperthyroidism At that time her TSH was low at 0.04 without increased free T4 in 06/2017  Since 06/2017 she was taking only 25 g levothyroxine using brand name Synthroid  RECENT HISTORY:  In 8/21 she was having a little more fatigue and needing to take a nap in the afternoon, also may have been feeling her heart beating faster at times.  Her TSH was down to 0.1 with increased T3 level and normal free T4 so she was told to leave off her Synthroid  She has been without any thyroid supplementation since 06/2020 She has had more fatigue but she thinks this is from difficulty sleeping, heart failure she was along with some irritability Because of not watching her diet or exercising much she has gained weight  However her thyroid levels are still consistently normal   Wt Readings from Last 3 Encounters:  04/08/22 155 lb 12.8 oz (70.7 kg)  04/02/21 143 lb 9.6 oz (65.1 kg)  09/30/20 142 lb 9.6 oz (64.7 kg)    Her thyroid levels as  below  Lab Results  Component Value Date   TSH 1.16 04/05/2022   TSH 1.28 03/30/2021   TSH 1.87 09/24/2020   FREET4 0.70 04/05/2022   FREET4 0.65 03/30/2021   FREET4 0.63 09/24/2020   Lab Results  Component Value Date   T3FREE 3.5 09/24/2020   T3FREE 4.4 (H) 07/16/2020   T3FREE 3.6 09/15/2017      THYROID nodule: She has had likely 2 previous needle aspirations for her partially cystic thyroid NODULE several years ago, with benign results.   The ultrasound report from 2001 showed a mixed cystic/solid nodule on the right side measuring 3.9 cm in size Also left lobe is hypoplastic    Allergies as of 04/08/2022   No Known Allergies      Medication List        Accurate as of Apr 08, 2022  8:12 AM. If you have any questions, ask your nurse or doctor.          STOP taking these medications    amoxicillin 875 MG tablet Commonly known as: AMOXIL Stopped by: Reather LittlerAjay Nelissa Bolduc, MD       TAKE these medications    ibuprofen 200 MG tablet Commonly known as: ADVIL Take 200 mg by mouth every 6 (six) hours as needed.   levocetirizine 5 MG tablet Commonly known as: XYZAL Take 1 tablet (5 mg total) by mouth every evening.   MULTIPLE VITAMIN  PO Take 1 tablet by mouth daily.   Naproxen Sodium 220 MG Caps Take by mouth as needed.   Probiotic-10 Caps Take 1 capsule by mouth daily.   pseudoephedrine 60 MG tablet Commonly known as: SUDAFED Take 1 tablet (60 mg total) by mouth every 8 (eight) hours as needed for congestion.         Past Medical History:  Diagnosis Date   H/O gestational diabetes mellitus, not currently pregnant    Hypothyroidism    PONV (postoperative nausea and vomiting)     Past Surgical History:  Procedure Laterality Date   AUGMENTATION MAMMAPLASTY Bilateral    BREAST BIOPSY Left 2016   BREAST ENHANCEMENT SURGERY  2015   LAPAROSCOPIC BILATERAL SALPINGECTOMY Bilateral 07/14/2018   Procedure: LAPAROSCOPIC BILATERAL SALPINGECTOMY;  Surgeon:  Lavina Hamman, MD;  Location: [facility];  Service: Gynecology;  Laterality: Bilateral;   TONSILLECTOMY     WISDOM TOOTH EXTRACTION      Family History  Problem Relation Age of Onset   Diabetes Paternal Grandmother    CAD Father    Cancer Father     Social History:  reports that she has been smoking cigarettes. She has never used smokeless tobacco. She reports current alcohol use. She reports that she does not use drugs.  Allergies: No Known Allergies   ROS:  She has had mild longstanding hyperlipidemia  She has done well with cutting back on fatty foods and red meat Her LDL is significantly better than usual and further improved, previously over 100  Lab Results  Component Value Date   CHOL 183 04/05/2022   CHOL 149 07/16/2020   CHOL 154 03/18/2020   Lab Results  Component Value Date   HDL 61.70 04/05/2022   HDL 53.50 07/16/2020   HDL 45.80 03/18/2020   Lab Results  Component Value Date   LDLCALC 106 (H) 04/05/2022   LDLCALC 82 07/16/2020   LDLCALC 89 03/18/2020   Lab Results  Component Value Date   TRIG 72.0 04/05/2022   TRIG 67.0 07/16/2020   TRIG 96.0 03/18/2020   Lab Results  Component Value Date   CHOLHDL 3 04/05/2022   CHOLHDL 3 07/16/2020   CHOLHDL 3 03/18/2020   No results found for: LDLDIRECT     Examination:   BP 120/80 (BP Location: Left Arm, Patient Position: Sitting, Cuff Size: Small)   Pulse 77   Ht 5\' 2"  (1.575 m)   Wt 155 lb 12.8 oz (70.7 kg)   SpO2 97%   BMI 28.50 kg/m   Thyroid nodule in the isthmus is visible on inspection Nodule is about 4 cm in size and fleshy/slightly soft and smooth Rest of the thyroid not palpable No lymphadenopathy in the neck     Assessments/PLAN:   1.  History of hypothyroidism, previously mild, presenting at age 46  Her thyroid levels are again normal without any supplementation No history of positive TPO antibodies in the past  The free T4 is noted as low and TSH is  consistently normal now No need for thyroid replacement   2. Long-standing benign partially cystic thyroid nodule, clinically may be minimally larger since she has been off thyroid suppression Since she has had 2 biopsies in the past which were benign Most recent ultrasound showed the nodule to be 3.9 cm and clinically it measures 4 cm today No further evaluation needed unless there is a significant increase in size  3.  History of hypercholesterolemia: With her weight gain and dietary indiscretion  she has had a 17 mg increase in her LDL compared to most recent levels and she is now motivated to change her diet as well as work on exercise  4.  Perimenopausal status: She is not completely in menopause with irregular cycles and discussed that if she needs short-term HRT for symptomatic relief would be reasonable to do and she will follow-up with her gynecologist for this  Reather Littler 04/08/2022, 8:12 AM

## 2023-04-11 ENCOUNTER — Other Ambulatory Visit: Payer: 59

## 2023-04-11 ENCOUNTER — Other Ambulatory Visit: Payer: Self-pay

## 2023-04-11 ENCOUNTER — Other Ambulatory Visit (INDEPENDENT_AMBULATORY_CARE_PROVIDER_SITE_OTHER): Payer: 59

## 2023-04-11 DIAGNOSIS — E041 Nontoxic single thyroid nodule: Secondary | ICD-10-CM

## 2023-04-12 LAB — TSH: TSH: 0.75 u[IU]/mL (ref 0.35–5.50)

## 2023-04-12 LAB — T4, FREE: Free T4: 0.57 ng/dL — ABNORMAL LOW (ref 0.60–1.60)

## 2023-04-13 ENCOUNTER — Ambulatory Visit: Payer: 59 | Admitting: Endocrinology

## 2023-04-13 ENCOUNTER — Encounter: Payer: Self-pay | Admitting: Endocrinology

## 2023-04-13 VITALS — BP 112/80 | HR 98 | Ht 62.0 in | Wt 170.0 lb

## 2023-04-13 DIAGNOSIS — R5383 Other fatigue: Secondary | ICD-10-CM | POA: Diagnosis not present

## 2023-04-13 DIAGNOSIS — E041 Nontoxic single thyroid nodule: Secondary | ICD-10-CM | POA: Diagnosis not present

## 2023-04-13 DIAGNOSIS — E7800 Pure hypercholesterolemia, unspecified: Secondary | ICD-10-CM

## 2023-04-13 MED ORDER — SYNTHROID 25 MCG PO TABS: 25.0000 ug | ORAL_TABLET | Freq: Every day | ORAL | 3 refills | Status: DC

## 2023-04-13 NOTE — Progress Notes (Signed)
Patient ID: Paula Mayer, female   DOB: [birth year], 47 y.o.   MRN: [id number]    Reason for Appointment: Thyroid conditions, followup visit    History of Present Illness:   HYPOTHYROIDISM was first diagnosed  in 1994, following I-131 treatment for a hot nodules associated with subclinical hyperthyroidism.  Initially TSH was 10.1 Initially for a few years she had been on very high doses of thyroid supplement but her dose was progressively reduced later  She was taken off Synthroid because of negative TPO antibodies  but TSH  increased to 8.8 in 08/2012 and she had more fatigue; she was started back on Synthroid then She had been on a stable dose of 50 mcg Synthroid since about 2011  She does prefer the brand name medication, she thinks she feels jittery with generics  In 8/18 she was saying that she sometimes feels a little shaky but she thinks it was from her cutting out carbohydrates from her meals.   No other complaints suggestive of hyperthyroidism At that time her TSH was low at 0.04 without increased free T4 in 06/2017  Since 06/2017 she was taking only 25 g levothyroxine using brand name Synthroid  RECENT HISTORY:  In 8/21 she was having a little more fatigue and needing to take a nap in the afternoon, also may have been feeling her heart beating faster at times.  Her TSH was down to 0.1 with increased T3 level and normal free T4 so she was told to leave off her Synthroid  She has been euthyroid since 06/2020 when levothyroxine was stopped as above  Although she generally has had fatigue in the past she thinks this is worse lately She also has gained further weight No cold intolerance or hair loss  Although TSH is normal her free T4 is now low   Wt Readings from Last 3 Encounters:  04/13/23 170 lb (77.1 kg)  04/08/22 155 lb 12.8 oz (70.7 kg)  04/02/21 143 lb 9.6 oz (65.1 kg)    Her thyroid levels as below  Lab Results  Component Value Date   TSH  0.75 04/11/2023   TSH 1.16 04/05/2022   TSH 1.28 03/30/2021   FREET4 0.57 (L) 04/11/2023   FREET4 0.70 04/05/2022   FREET4 0.65 03/30/2021   Lab Results  Component Value Date   T3FREE 3.5 09/24/2020   T3FREE 4.4 (H) 07/16/2020   T3FREE 3.6 09/15/2017      THYROID nodule: She has had likely 2 previous needle aspirations for her partially cystic thyroid NODULE several years ago, with benign results.   The ultrasound report from 2001 showed a mixed cystic/solid nodule on the right side measuring 3.9 cm in size Also left lobe is hypoplastic    Allergies as of 04/13/2023   No Known Allergies      Medication List        Accurate as of Apr 13, 2023  8:34 AM. If you have any questions, ask your nurse or doctor.          ibuprofen 200 MG tablet Commonly known as: ADVIL Take 200 mg by mouth every 6 (six) hours as needed.   levocetirizine 5 MG tablet Commonly known as: XYZAL Take 1 tablet (5 mg total) by mouth every evening.   MULTIPLE VITAMIN PO Take 1 tablet by mouth daily.   Naproxen Sodium 220 MG Caps Take by mouth as needed.   Probiotic-10 Caps Take 1 capsule by  mouth daily.   pseudoephedrine 60 MG tablet Commonly known as: SUDAFED Take 1 tablet (60 mg total) by mouth every 8 (eight) hours as needed for congestion.         Past Medical History:  Diagnosis Date   H/O gestational diabetes mellitus, not currently pregnant    Hypothyroidism    PONV (postoperative nausea and vomiting)     Past Surgical History:  Procedure Laterality Date   AUGMENTATION MAMMAPLASTY Bilateral    BREAST BIOPSY Left 2016   BREAST ENHANCEMENT SURGERY  2015   LAPAROSCOPIC BILATERAL SALPINGECTOMY Bilateral 07/14/2018   Procedure: LAPAROSCOPIC BILATERAL SALPINGECTOMY;  Surgeon: Lavina Hamman, MD;  Location: [facility];  Service: Gynecology;  Laterality: Bilateral;   TONSILLECTOMY     WISDOM TOOTH EXTRACTION      Family History  Problem Relation Age of  Onset   Diabetes Paternal Grandmother    CAD Father    Cancer Father     Social History:  reports that she has been smoking cigarettes. She has never used smokeless tobacco. She reports current alcohol use. She reports that she does not use drugs.  Allergies: No Known Allergies   ROS:  She has had mild longstanding hyperlipidemia  She has not done as well with her diet and has stopped eating healthier foods such as salads and yogurt compared to before Also has had weight gain  Lab Results  Component Value Date   CHOL 183 04/05/2022   CHOL 149 07/16/2020   CHOL 154 03/18/2020   Lab Results  Component Value Date   HDL 61.70 04/05/2022   HDL 53.50 07/16/2020   HDL 45.80 03/18/2020   Lab Results  Component Value Date   LDLCALC 106 (H) 04/05/2022   LDLCALC 82 07/16/2020   LDLCALC 89 03/18/2020   Lab Results  Component Value Date   TRIG 72.0 04/05/2022   TRIG 67.0 07/16/2020   TRIG 96.0 03/18/2020   Lab Results  Component Value Date   CHOLHDL 3 04/05/2022   CHOLHDL 3 07/16/2020   CHOLHDL 3 03/18/2020   No results found for: "LDLDIRECT"  She has infrequent menstrual cycles with the last one in March but prior to that was in October Less hot flashes recently and gynecologist has not recommended HRT    Examination:   BP 112/80   Pulse 98   Ht 5\' 2"  (1.575 m)   Wt 170 lb (77.1 kg)   SpO2 99%   BMI 31.09 kg/m   Thyroid nodule in the isthmus is mildly prominent Nodule is about 3 cm in size, relatively soft and small Rest of the thyroid not palpable No lymphadenopathy in the neck Biceps reflexes show slightly brisk relaxation     Assessments/PLAN:   1.  History of hypothyroidism, previously mild, presenting at age 76  Although she had been without any thyroid supplementation the last 3 years she appears to have some unusual fatigue and weight gain now No history of positive TPO antibodies in the past  The free T4 is now low even though TSH is normal  likely indicating mild secondary hypothyroidism  2. Long-standing benign partially cystic thyroid nodule, clinically may be minimally larger since she has been off thyroid suppression Since she has had 2 biopsies in the past which were benign Most recent ultrasound showed the nodule to be 3.9 cm  However on exam her thyroid nodule is smaller compared to 4 cm size previously Will continue to monitor clinically  3.  History of hypercholesterolemia:  Will recheck on the next visit  4.  Perimenopausal status: Followed by gynecologist Unclear what her fatigue is from but may be combination of hypothyroidism, insomnia and stress  Reather Littler 04/13/2023, 8:34 AM

## 2023-06-06 ENCOUNTER — Other Ambulatory Visit (INDEPENDENT_AMBULATORY_CARE_PROVIDER_SITE_OTHER): Payer: 59

## 2023-06-06 DIAGNOSIS — E041 Nontoxic single thyroid nodule: Secondary | ICD-10-CM | POA: Diagnosis not present

## 2023-06-06 DIAGNOSIS — E7800 Pure hypercholesterolemia, unspecified: Secondary | ICD-10-CM

## 2023-06-06 LAB — T3, FREE: T3, Free: 4.5 pg/mL — ABNORMAL HIGH (ref 2.3–4.2)

## 2023-06-06 LAB — TSH: TSH: 0.03 u[IU]/mL — ABNORMAL LOW (ref 0.35–5.50)

## 2023-06-06 LAB — LIPID PANEL
Cholesterol: 177 mg/dL (ref 0–200)
HDL: 47.1 mg/dL (ref >39.00)
LDL Cholesterol: 101 mg/dL — ABNORMAL HIGH (ref 0–99)
NonHDL: 129.89
Total CHOL/HDL Ratio: 4
Triglycerides: 142 mg/dL (ref 0.0–149.0)
VLDL: 28.4 mg/dL (ref 0.0–40.0)

## 2023-06-06 LAB — T4, FREE: Free T4: 0.96 ng/dL (ref 0.60–1.60)

## 2023-06-08 NOTE — Progress Notes (Signed)
Patient ID: Paula Mayer, female   DOB: [birth year], 47 y.o.   MRN: [id number]    Reason for Appointment: Thyroid conditions, followup visit    History of Present Illness:   HYPOTHYROIDISM was first diagnosed  in 1994, following I-131 treatment for a hot nodules associated with subclinical hyperthyroidism.  Initially TSH was 10.1 Initially for a few years she had been on very high doses of thyroid supplement but her dose was progressively reduced later  She was taken off Synthroid because of negative TPO antibodies  but TSH  increased to 8.8 in 08/2012 and she had more fatigue; she was started back on Synthroid then She had been on a stable dose of 50 mcg Synthroid since about 2011  She does prefer the brand name medication, she thinks she feels jittery with generics  In 8/18 she was saying that she sometimes feels a little shaky but she thinks it was from her cutting out carbohydrates from her meals.   No other complaints suggestive of hyperthyroidism At that time her TSH was low at 0.04 without increased free T4 in 06/2017  Since 06/2017 she was taking only 25 g levothyroxine using brand name Synthroid  RECENT HISTORY:  In 8/21 she was having more fatigue and needing to take a nap in the afternoon, also may have been feeling her heart beating faster at times.  Her TSH was down to 0.1 with increased T3 level and normal free T4 so she was told to leave off her Synthroid  On her follow-up in 03/2023 she was complaining of significantly more fatigue, some lethargy and needing to take a nap but also was having some issues with insomnia She also had gained significant amount of weight  Although TSH at that time was normal her free T4 was lower at 0.57 She is now taking 25 mcg Synthroid About 2 weeks after starting the Synthroid she started feeling less fatigue and is feeling overall much better especially in the last month She is also trying to be more active and is  sleeping better No shakiness or excessive sweating She does have some palpitations at times and rapid heart rate but this is relatively chronic and not any worse   Wt Readings from Last 3 Encounters:  06/09/23 168 lb 14.4 oz (76.6 kg)  04/13/23 170 lb (77.1 kg)  04/08/22 155 lb 12.8 oz (70.7 kg)    Her thyroid levels as below  Lab Results  Component Value Date   TSH 0.03 (L) 06/06/2023   TSH 0.75 04/11/2023   TSH 1.16 04/05/2022   FREET4 0.96 06/06/2023   FREET4 0.57 (L) 04/11/2023   FREET4 0.70 04/05/2022   Lab Results  Component Value Date   T3FREE 4.5 (H) 06/06/2023   T3FREE 3.5 09/24/2020   T3FREE 4.4 (H) 07/16/2020      THYROID nodule: She has had likely 2 previous needle aspirations for her partially cystic thyroid NODULE several years ago, with benign results.   The ultrasound report from 2001 showed a mixed cystic/solid nodule on the right side measuring 3.9 cm in size Also left lobe is hypoplastic    Allergies as of 06/09/2023   No Known Allergies      Medication List        Accurate as of June 09, 2023  8:11 AM. If you have any questions, ask your nurse or doctor.          ibuprofen 200 MG  tablet Commonly known as: ADVIL Take 200 mg by mouth every 6 (six) hours as needed.   levocetirizine 5 MG tablet Commonly known as: XYZAL Take 1 tablet (5 mg total) by mouth every evening.   MULTIPLE VITAMIN PO Take 1 tablet by mouth daily.   Naproxen Sodium 220 MG Caps Take by mouth as needed.   Probiotic-10 Caps Take 1 capsule by mouth daily.   pseudoephedrine 60 MG tablet Commonly known as: SUDAFED Take 1 tablet (60 mg total) by mouth every 8 (eight) hours as needed for congestion.   Synthroid 25 MCG tablet Generic drug: levothyroxine Take 1 tablet (25 mcg total) by mouth daily before breakfast.         Past Medical History:  Diagnosis Date   H/O gestational diabetes mellitus, not currently pregnant    Hypothyroidism    PONV  (postoperative nausea and vomiting)     Past Surgical History:  Procedure Laterality Date   AUGMENTATION MAMMAPLASTY Bilateral    BREAST BIOPSY Left 2016   BREAST ENHANCEMENT SURGERY  2015   LAPAROSCOPIC BILATERAL SALPINGECTOMY Bilateral 07/14/2018   Procedure: LAPAROSCOPIC BILATERAL SALPINGECTOMY;  Surgeon: Lavina Hamman, MD;  Location: [facility];  Service: Gynecology;  Laterality: Bilateral;   TONSILLECTOMY     WISDOM TOOTH EXTRACTION      Family History  Problem Relation Age of Onset   Diabetes Paternal Grandmother    CAD Father    Cancer Father     Social History:  reports that she has quit smoking. Her smoking use included cigarettes. She uses smokeless tobacco. She reports current alcohol use. She reports that she does not use drugs.  Allergies: No Known Allergies   ROS:  She has had mild longstanding HYPERCHOLESTEROLEMIA  Since 5/24 she has reduced red meat, eating more salads and LDL is slightly better than before  Lab Results  Component Value Date   CHOL 177 06/06/2023   CHOL 183 04/05/2022   CHOL 149 07/16/2020   Lab Results  Component Value Date   HDL 47.10 06/06/2023   HDL 61.70 04/05/2022   HDL 53.50 07/16/2020   Lab Results  Component Value Date   LDLCALC 101 (H) 06/06/2023   LDLCALC 106 (H) 04/05/2022   LDLCALC 82 07/16/2020   Lab Results  Component Value Date   TRIG 142.0 06/06/2023   TRIG 72.0 04/05/2022   TRIG 67.0 07/16/2020   Lab Results  Component Value Date   CHOLHDL 4 06/06/2023   CHOLHDL 3 04/05/2022   CHOLHDL 3 07/16/2020   No results found for: "LDLDIRECT"  She has infrequent menstrual cycles with the last one in 3/24 but prior to that was in October Only mild hot flashes recently and gynecologist has not recommended HRT    Examination:   BP 124/82 (BP Location: Left Arm, Patient Position: Sitting, Cuff Size: Large)   Pulse 85   Ht 5\' 2"  (1.575 m)   Wt 168 lb 14.4 oz (76.6 kg)   SpO2 96%   BMI 30.89  kg/m   Thyroid nodule in the isthmus is relatively soft and about 3 cm in size, smooth No tremor present Hands are not unusually warm Biceps reflexes show slightly brisk relaxation     Assessments/PLAN:   1.  History of hypothyroidism, previously mild, presenting at age 81  She is back on Synthroid since 5/24 because of symptoms of worsening fatigue and low free T4 level without increased TSH Likely has mild secondary hypothyroidism  No history of positive TPO  antibodies in the past  She feels better with only 25 mcg of Synthroid and no symptoms suggestive of hyperthyroidism The free T4 is now back to normal even though TSH is suppressed and free T3 is slightly high  Since she has subjectively felt better with thyroid supplementation we will continue this but try only 12.5 mg levothyroxine.  If she starts having more fatigue she will let us know  2. Long-standing benign partially cystic thyroid nodule, recently stable in size on exam  3.  History of hypercholesterolemia: LDL nearly at target at 101, she will continue healthy diet    Reather Littler 06/09/2023, 8:11 AM

## 2023-06-09 ENCOUNTER — Encounter: Payer: Self-pay | Admitting: Endocrinology

## 2023-06-09 ENCOUNTER — Ambulatory Visit: Payer: 59 | Admitting: Endocrinology

## 2023-06-09 VITALS — BP 124/82 | HR 85 | Ht 62.0 in | Wt 168.9 lb

## 2023-06-09 DIAGNOSIS — E7800 Pure hypercholesterolemia, unspecified: Secondary | ICD-10-CM

## 2023-06-09 DIAGNOSIS — E038 Other specified hypothyroidism: Secondary | ICD-10-CM | POA: Diagnosis not present

## 2023-06-09 DIAGNOSIS — R5383 Other fatigue: Secondary | ICD-10-CM | POA: Diagnosis not present

## 2023-09-01 ENCOUNTER — Other Ambulatory Visit: Payer: Self-pay

## 2023-09-01 DIAGNOSIS — E038 Other specified hypothyroidism: Secondary | ICD-10-CM

## 2023-09-02 ENCOUNTER — Other Ambulatory Visit (INDEPENDENT_AMBULATORY_CARE_PROVIDER_SITE_OTHER): Payer: 59

## 2023-09-02 DIAGNOSIS — E038 Other specified hypothyroidism: Secondary | ICD-10-CM

## 2023-09-02 LAB — T3, FREE: T3, Free: 4.1 pg/mL (ref 2.3–4.2)

## 2023-09-02 LAB — TSH: TSH: 0.17 u[IU]/mL — ABNORMAL LOW (ref 0.35–5.50)

## 2023-09-02 LAB — T4, FREE: Free T4: 0.83 ng/dL (ref 0.60–1.60)

## 2023-09-09 ENCOUNTER — Encounter: Payer: Self-pay | Admitting: Endocrinology

## 2023-09-09 ENCOUNTER — Ambulatory Visit: Payer: 59 | Admitting: Endocrinology

## 2023-09-09 VITALS — BP 116/80 | HR 72 | Resp 20 | Ht 62.0 in | Wt 168.4 lb

## 2023-09-09 DIAGNOSIS — E0510 Thyrotoxicosis with toxic single thyroid nodule without thyrotoxic crisis or storm: Secondary | ICD-10-CM

## 2023-09-09 DIAGNOSIS — E7800 Pure hypercholesterolemia, unspecified: Secondary | ICD-10-CM | POA: Diagnosis not present

## 2023-09-09 DIAGNOSIS — E890 Postprocedural hypothyroidism: Secondary | ICD-10-CM | POA: Diagnosis not present

## 2023-09-09 DIAGNOSIS — E041 Nontoxic single thyroid nodule: Secondary | ICD-10-CM | POA: Diagnosis not present

## 2023-09-09 MED ORDER — SYNTHROID 25 MCG PO TABS: 12.5000 ug | ORAL_TABLET | Freq: Every day | ORAL | 3 refills | Status: DC

## 2023-09-09 NOTE — Progress Notes (Unsigned)
Outpatient Endocrinology Note [place] Merwin Breden, MD  09/11/23  Patient's Name: Paula Mayer    DOB: 10-13-1976    MRN: [id number]  REASON OF VISIT: Follow-up for hypothyroidism  PCP: [place], Betty G, MD  HISTORY OF PRESENT ILLNESS:   Paula Mayer is a 47 y.o. old female with past medical history as listed below is presented for a follow up of postablative hypothyroidism.   Pertinent Thyroid History: - HYPOTHYROIDISM was first diagnosed  in 1994, following I-131 treatment for a hot nodules associated with subclinical hyperthyroidism. Initially TSH was 10.1. Initially for a few years she had been on very high doses of thyroid supplement but her dose was progressively reduced later.  - She was taken off Synthroid because of negative TPO antibodies  but TSH  increased to 8.8 in 08/2012 and she had more fatigue; she was started back on Synthroid then. She had been on a stable dose of 50 mcg Synthroid since about 2011. Since 06/2017 she was taking only 25 g levothyroxine using brand name Synthroid.   - In 06/2020: she has palpitation, with low TSH 0.1 and mildly elevated FT3, levothyroxine was stopped. She remained off of levothyroxine until 03/2023 with normal thyroid function tests.  - In 03/2023: she had normal TSH and mildly low Ft4, she was symptomatic with fatigue, insomnia, weight gain, started back on levothyroxine 25 mcg daily.  - In July 2024: TSH was0.03, mildly elevated Ft3, levothyroxine was decreased to 12. daily.  She does prefer the brand name medication, she thinks she feels jittery with generics.  # THYROID nodule: She has had likely 2 previous needle aspirations for her partially cystic thyroid NODULE several years ago, with benign results.    The ultrasound report from 2001 showed a mixed cystic/solid nodule on the right side measuring 3.9 cm in size Also left lobe is hypoplastic  # She has had mild longstanding HYPERCHOLESTEROLEMIA     Interval history  09/09/2023 Patient is currently taking levothyroxine / synthroid 12.5 mcg daily. She complains of occasional palpitation especially while sleeping. No heat intolerance. Normal energy, weight has been stable. No other complaints today. She has been taking 1/2 tab of levothyroxine daily in the morning in empty stomach. Denies taking biotin or multivitamins.  Recent lab with mildly low TSH, is improving and normal Ft4 and Ft3.   Latest Reference Range & Units 09/02/23 07:40  TSH 0.35 - 5.50 uIU/mL 0.17 (L)  Triiodothyronine,Free,Serum 2.3 - 4.2 pg/mL 4.1  T4,Free(Direct) 0.60 - 1.60 ng/dL 9.14  (L): Data is abnormally low  REVIEW OF SYSTEMS:  As per history of present illness.   PAST MEDICAL HISTORY: Past Medical History:  Diagnosis Date   H/O gestational diabetes mellitus, not currently pregnant    Hypothyroidism    PONV (postoperative nausea and vomiting)     PAST SURGICAL HISTORY: Past Surgical History:  Procedure Laterality Date   AUGMENTATION MAMMAPLASTY Bilateral    BREAST BIOPSY Left 2016   BREAST ENHANCEMENT SURGERY  2015   LAPAROSCOPIC BILATERAL SALPINGECTOMY Bilateral 07/14/2018   Procedure: LAPAROSCOPIC BILATERAL SALPINGECTOMY;  Surgeon: Lavina Hamman, MD;  Location: [facility];  Service: Gynecology;  Laterality: Bilateral;   TONSILLECTOMY     WISDOM TOOTH EXTRACTION      ALLERGIES: No Known Allergies  FAMILY HISTORY:  Family History  Problem Relation Age of Onset   Diabetes Paternal Grandmother    CAD Father    Cancer Father     SOCIAL HISTORY: Social History  Socioeconomic History   Marital status: Divorced    Spouse name: Not on file   Number of children: Not on file   Years of education: Not on file   Highest education level: Not on file  Occupational History   Not on file  Tobacco Use   Smoking status: Former    Types: Cigarettes   Smokeless tobacco: Current  Substance and Sexual Activity   Alcohol use: Yes    Comment:  socially   Drug use: No   Sexual activity: Not on file  Other Topics Concern   Not on file  Social History Narrative   Not on file   Social Determinants of Health   Financial Resource Strain: Not on file  Food Insecurity: Not on file  Transportation Needs: Not on file  Physical Activity: Not on file  Stress: Not on file  Social Connections: Not on file    MEDICATIONS:  Current Outpatient Medications  Medication Sig Dispense Refill   ibuprofen (ADVIL,MOTRIN) 200 MG tablet Take 200 mg by mouth every 6 (six) hours as needed.      levocetirizine (XYZAL) 5 MG tablet Take 1 tablet (5 mg total) by mouth every evening. 90 tablet 0   MULTIPLE VITAMIN PO Take 1 tablet by mouth daily.     Naproxen Sodium 220 MG CAPS Take by mouth as needed.     Probiotic Product (PROBIOTIC-10) CAPS Take 1 capsule by mouth daily.     pseudoephedrine (SUDAFED) 60 MG tablet Take 1 tablet (60 mg total) by mouth every 8 (eight) hours as needed for congestion. 30 tablet 0   SYNTHROID 25 MCG tablet Take 0.5 tablets (12.5 mcg total) by mouth daily before breakfast. 45 tablet 3   No current facility-administered medications for this visit.    PHYSICAL EXAM: Vitals:   09/09/23 0807  BP: 116/80  Pulse: 72  Resp: 20  SpO2: 98%  Weight: 168 lb 6.4 oz (76.4 kg)  Height: 5\' 2"  (1.575 m)   Body mass index is 30.8 kg/m.  Wt Readings from Last 3 Encounters:  09/09/23 168 lb 6.4 oz (76.4 kg)  06/09/23 168 lb 14.4 oz (76.6 kg)  04/13/23 170 lb (77.1 kg)     General: Well developed, well nourished female in no apparent distress.  HEENT: AT/[place], no external lesions. Hearing intact to the spoken word Eyes:  Conjunctiva clear and no icterus. Neck: Trachea midline, neck supple , palpable, right isthmus soft nodule ~ 3 cm, mobile, non tender. Lungs: Clear to auscultation, no wheeze. Respirations not labored Heart: S1S2, Regular in rate and rhythm. No loud murmurs Abdomen: Soft, non tender, non distended Neurologic:  Alert, oriented, normal speech, deep tendon biceps reflexes normal,  no gross focal neurological deficit Extremities: No pedal pitting edema, no tremors of outstretched hands Skin: Warm, color good.  Psychiatric: Does not appear depressed or anxious  PERTINENT HISTORIC LABORATORY AND IMAGING STUDIES:  All pertinent laboratory results were reviewed. Please see HPI also for further details.   TSH  Date Value Ref Range Status  09/02/2023 0.17 (L) 0.35 - 5.50 uIU/mL Final  06/06/2023 0.03 (L) 0.35 - 5.50 uIU/mL Final  04/11/2023 0.75 0.35 - 5.50 uIU/mL Final     ASSESSMENT / PLAN  1. Postablative hypothyroidism   2. Thyroid nodule   3. Hypercholesteremia    - Patient has postablative hypothyroidism, treated with RAI ablation in 1990s, levothyroxine / synthroid dose was been adjusted multiple times in the past, was off of levothyroxine as well in  the past, levothyroxine was restarted in 03/2023 at daily and decreased to 12.5 mcg in 05/2023.  - Recent lab with mildly low TSH 0.17, improving and normal FT4 and Ft3. She is clinically euthyroid. Recent rare palpitation.   Plan: - Continue synthroid 12.5 mcg daily. - Thyroid function test in 6 months.  # Thyroid nodule : cystic nodule / isthmus, prir FNA cytology benign. - Last [place] in 2011. - Will consider US thyroid to monitor in future visit.  # Hypercholesteremia : - Last lipid level in 05/2023 acceptable. Advised for diet control.  Diagnoses and all orders for this visit:  Postablative hypothyroidism -     T4, free; Future -     TSH; Future  Thyroid nodule -     T4, free; Future -     TSH; Future -     T3, free; Future  Hypercholesteremia  Other orders -     SYNTHROID 25 MCG tablet; Take 0.5 tablets (12.5 mcg total) by mouth daily before breakfast.    DISPOSITION Follow up in clinic in 6 months suggested.  All questions answered and patient verbalized understanding of the plan.  [place] Hiep Ollis, MD [facility]  Endocrinology [facility] [address] [place], [address] [place], [place] [address] Phone # [phone]  At least part of this note was generated using voice recognition software. Inadvertent word errors may have occurred, which were not recognized during the proofreading process.

## 2024-02-12 ENCOUNTER — Encounter (HOSPITAL_COMMUNITY): Payer: Self-pay

## 2024-02-12 ENCOUNTER — Other Ambulatory Visit: Payer: Self-pay

## 2024-02-12 ENCOUNTER — Emergency Department (HOSPITAL_COMMUNITY)

## 2024-02-12 ENCOUNTER — Emergency Department (HOSPITAL_COMMUNITY)
Admission: EM | Admit: 2024-02-12 | Discharge: 2024-02-12 | Disposition: A | Discharge status: Home / Self Care | Attending: Emergency Medicine | Admitting: Emergency Medicine

## 2024-02-12 DIAGNOSIS — M93261 Osteochondritis dissecans, right knee: Secondary | ICD-10-CM | POA: Insufficient documentation

## 2024-02-12 DIAGNOSIS — S8991XA Unspecified injury of right lower leg, initial encounter: Secondary | ICD-10-CM | POA: Diagnosis present

## 2024-02-12 DIAGNOSIS — S8001XA Contusion of right knee, initial encounter: Secondary | ICD-10-CM | POA: Diagnosis not present

## 2024-02-12 DIAGNOSIS — M899 Disorder of bone, unspecified: Secondary | ICD-10-CM

## 2024-02-12 DIAGNOSIS — W19XXXA Unspecified fall, initial encounter: Secondary | ICD-10-CM | POA: Insufficient documentation

## 2024-02-12 NOTE — ED Triage Notes (Signed)
 Pt tripped and fell on [place] 2 weeks ago and right knee is still painful.

## 2024-02-12 NOTE — ED Provider Notes (Signed)
  EMERGENCY DEPARTMENT AT [facility] Provider Note   CSN: [id number] Arrival date & time: 02/12/24  1206     History  Chief Complaint  Patient presents with   Knee Injury    Paula Mayer is a 48 y.o. female.  HPI      Paula Mayer is a 48 y.o. female who presents to the Emergency Department complaining of right knee pain x 2 weeks.  She endorses mechanical fall while vacationing in [place].  States she landed on both knees but right knee has been continuing to cause pain.  The pain is worse with bending and with palpation over the patella.  She states that her knee feels tight when she attempts to bend it.  She denies any weakness of her leg numbness or open wound.  She endorses having swelling at onset but this has improved.  No hip calf or ankle pain.     Home Medications Prior to Admission medications   Medication Sig Start Date End Date Taking? Authorizing Provider  ibuprofen (ADVIL,MOTRIN) 200 MG tablet Take 200 mg by mouth every 6 (six) hours as needed.     [provider]  levocetirizine (XYZAL) 5 MG tablet Take 1 tablet (5 mg total) by mouth every evening. 01/10/22   Wallis Bamberg, PA-C  MULTIPLE VITAMIN PO Take 1 tablet by mouth daily.    [provider]  Naproxen Sodium 220 MG CAPS Take by mouth as needed.    [provider]  Probiotic Product (PROBIOTIC-10) CAPS Take 1 capsule by mouth daily.    [provider]  pseudoephedrine (SUDAFED) 60 MG tablet Take 1 tablet (60 mg total) by mouth every 8 (eight) hours as needed for congestion. 01/10/22   Wallis Bamberg, PA-C  SYNTHROID 25 MCG tablet Take 0.5 tablets (12.5 mcg total) by mouth daily before breakfast. 09/09/23   [place], [place], MD      Allergies    Patient has no known allergies.    Review of Systems   Review of Systems  Constitutional:  Negative for chills and fever.  Respiratory:  Negative for shortness of breath.   Cardiovascular:   Negative for chest pain.  Gastrointestinal:  Negative for abdominal pain, nausea and vomiting.  Musculoskeletal:  Positive for arthralgias (Right knee pain). Negative for back pain and neck pain.  Skin:  Negative for color change and wound.  Neurological:  Negative for weakness and numbness.    Physical Exam Updated Vital Signs BP 137/85 (BP Location: Right Arm)   Pulse 71   Temp 98 F (36.7 C) (Oral)   Resp 16   Ht 5\' 2"  (1.575 m)   Wt 77.1 kg   SpO2 100%   BMI 31.09 kg/m  Physical Exam Vitals and nursing note reviewed.  Constitutional:      General: She is not in acute distress.    Appearance: Normal appearance. She is not ill-appearing.  Cardiovascular:     Rate and Rhythm: Normal rate and regular rhythm.     Pulses: Normal pulses.  Pulmonary:     Effort: Pulmonary effort is normal.  Musculoskeletal:        General: Tenderness and signs of injury present. No deformity.     Right knee: No deformity, effusion or crepitus. Tenderness present. Normal alignment and normal meniscus.     Right lower leg: No swelling. No edema.     Left lower leg: No edema.     Comments: Localized tenderness over the  right patella.  Patellar tendon and quadricep tendon appears intact.  No palpable effusion.  Mild ecchymosis noted over the patella.  Negative valgus and varus stress.  Skin:    General: Skin is warm.     Capillary Refill: Capillary refill takes less than 2 seconds.  Neurological:     General: No focal deficit present.     Mental Status: She is alert.     Sensory: No sensory deficit.     Motor: No weakness.     ED Results / Procedures / Treatments   Labs (all labs ordered are listed, but only abnormal results are displayed) Labs Reviewed - No data to display  EKG None  Radiology DG Knee Complete 4 Views Right Result Date: 02/12/2024 CLINICAL DATA:  Status post fall 2 weeks ago with persistent right knee pain EXAM: RIGHT KNEE - COMPLETE 4 VIEW COMPARISON:  None  Available. FINDINGS: No evidence of fracture, dislocation, or joint effusion. Ill-defined lucency in the superolateral patella. Soft tissues are unremarkable. IMPRESSION: 1. No acute displaced fracture or dislocation. 2. Ill-defined lucency in the superolateral patella, which may represent a small osteochondral lesion. Electronically Signed   By: Agustin Cree M.D.   On: 02/12/2024 14:14    Procedures Procedures    Medications Ordered in ED Medications - No data to display  ED Course/ Medical Decision Making/ A&P                                 Medical Decision Making Patient here for evaluation of right knee pain after mechanical fall 2 weeks ago.  Fell landing on both knees.  Has continued to have pain of the right knee.  Swelling at onset but this has improved.  No numbness or weakness of the extremity.  Suspect this is related to contusion, patellar or tibial plateau fracture also considered as well as dislocation sprain  Amount and/or Complexity of Data Reviewed Radiology: ordered.    Details: X-ray without evidence for acute displaced fracture or dislocation.  There is a ill-defined lucency in the superior lateral patella which may be related to osteochondral lesion. Discussion of management or test interpretation with external provider(s):  Patient notified of x-ray findings, will provide knee brace for support when standing or walking she is agreeable to symptomatic treatment with ibuprofen elevation and ice.  I have recommended close outpatient follow-up with orthopedics regarding x-ray findings.           Final Clinical Impression(s) / ED Diagnoses Final diagnoses:  Contusion of right knee, initial encounter  Osteochondral lesion    Rx / DC Orders ED Discharge Orders     None         Pauline Aus, PA-C 02/12/24 1523    Cathren Laine, MD 02/13/24 1340

## 2024-02-12 NOTE — Discharge Instructions (Signed)
 As discussed, ibuprofen 600 mg 3 times a day with food if needed for pain.  Elevate and apply ice packs on and off to your knee.  Use the knee brace when walking or standing.  Call Dr. Mort Sawyers office tomorrow to arrange follow-up appointment.

## 2024-02-14 ENCOUNTER — Ambulatory Visit: Admitting: Orthopedic Surgery

## 2024-02-14 ENCOUNTER — Encounter: Payer: Self-pay | Admitting: Orthopedic Surgery

## 2024-02-14 VITALS — BP 135/89 | HR 79 | Wt 170.0 lb

## 2024-02-14 DIAGNOSIS — S8001XA Contusion of right knee, initial encounter: Secondary | ICD-10-CM | POA: Diagnosis not present

## 2024-02-14 NOTE — Progress Notes (Signed)
 New Patient Visit  Assessment: Paula Mayer is a 48 y.o. female with the following: 1. Contusion of right knee, initial encounter  Plan: Vieva D Mayer fell a couple weeks ago, and landed on the anterior aspect of the right knee.  She has some residual swelling and bruising.  I believe that this is the reason for her decreased range of motion.  It remains sensitive to touch.  The knee is stable otherwise.  Radiographs are negative for acute injury.  There is a small area of concern within the patella, which may represent a small cyst, but this is not readily visible on the lateral view.  I do not think that she has an osteochondral lesion, as there is no loose body.  There is also minimal swelling within the joint.  Overall, provided reassurance.  If she continues to have issues with pain, lacking range of motion, we could obtain an MRI.  Follow-up: Return if symptoms worsen or fail to improve.  Subjective:  Chief Complaint  Patient presents with   Knee Pain    Right knee pain, injured 2 weeks ago in [place] fell.Date of injury 01/31/24    History of Present Illness: Paula Mayer is a 48 y.o. female who presents for evaluation of right knee pain.  She was out of town, when she tripped and fell a couple weeks ago.  She landed on her knee, as well as outstretched arms.  She did have some immediate pain and swelling.  She started taking ibuprofen.  The pain and swelling gradually subsided.  It remains tender to touch.  She accidentally dropped her purse, which hit the front of her knee, which was excruciating.  She presented the emergency department a few days ago.  Radiographs were negative for acute injury, with some concern for osteochondral lesion.  She was given a knee brace.  Currently, she is taking ibuprofen, maybe once per day.  She continues to have some tenderness.  She is lacking some range of motion.  No prior injuries to her right knee.   Review of  Systems: No fevers or chills No numbness or tingling No chest pain No shortness of breath No bowel or bladder dysfunction No GI distress No headaches   Medical History:  Past Medical History:  Diagnosis Date   H/O gestational diabetes mellitus, not currently pregnant    Hypothyroidism    PONV (postoperative nausea and vomiting)     Past Surgical History:  Procedure Laterality Date   AUGMENTATION MAMMAPLASTY Bilateral    BREAST BIOPSY Left 2016   BREAST ENHANCEMENT SURGERY  2015   LAPAROSCOPIC BILATERAL SALPINGECTOMY Bilateral 07/14/2018   Procedure: LAPAROSCOPIC BILATERAL SALPINGECTOMY;  Surgeon: Lavina Hamman, MD;  Location: [facility];  Service: Gynecology;  Laterality: Bilateral;   TONSILLECTOMY     WISDOM TOOTH EXTRACTION      Family History  Problem Relation Age of Onset   Diabetes Paternal Grandmother    CAD Father    Cancer Father    Social History   Tobacco Use   Smoking status: Former    Types: Cigarettes   Smokeless tobacco: Never  Vaping Use   Vaping status: Every Day  Substance Use Topics   Alcohol use: Not Currently    Comment: socially   Drug use: No    No Known Allergies  Current Meds  Medication Sig   ibuprofen (ADVIL,MOTRIN) 200 MG tablet Take 200 mg by mouth every 6 (six) hours as needed.  levocetirizine (XYZAL) 5 MG tablet Take 1 tablet (5 mg total) by mouth every evening.   MULTIPLE VITAMIN PO Take 1 tablet by mouth daily.   Naproxen Sodium 220 MG CAPS Take by mouth as needed.   Probiotic Product (PROBIOTIC-10) CAPS Take 1 capsule by mouth daily.   pseudoephedrine (SUDAFED) 60 MG tablet Take 1 tablet (60 mg total) by mouth every 8 (eight) hours as needed for congestion.   SYNTHROID 25 MCG tablet Take 0.5 tablets (12.5 mcg total) by mouth daily before breakfast.    Objective: BP 135/89   Pulse 79   Wt 170 lb (77.1 kg)   BMI 31.09 kg/m   Physical Exam:  General: Alert and oriented. and No acute  distress. Gait: Normal gait.  Evaluation of the right knee demonstrates some mild effusion and mild bruising over the superior aspect of the patella, extending over the quadriceps tendon.  She is able to maintain a straight leg raise.  She demonstrates good strength.  No increased laxity to varus or valgus stress.  She has tenderness to palpation over the superior pole of the patella.  Flexion beyond 110 degrees is painful.  Negative Lachman.  IMAGING: I personally reviewed images previously obtained from the ED  Graphs were obtained in the emergency department.  No acute injury.  No knee effusion.  Small lucency within the superior lateral aspect of the patella, which was described as possibly an osteochondral lesion.  The borders are well-circumscribed.  This is not visible on the lateral.  There is no loose body within the knee.   New Medications:  No orders of the defined types were placed in this encounter.     Oliver Barre, MD  02/14/2024 10:58 AM

## 2024-02-24 ENCOUNTER — Other Ambulatory Visit: Payer: Self-pay

## 2024-02-24 DIAGNOSIS — E041 Nontoxic single thyroid nodule: Secondary | ICD-10-CM

## 2024-02-24 DIAGNOSIS — E890 Postprocedural hypothyroidism: Secondary | ICD-10-CM

## 2024-03-06 ENCOUNTER — Other Ambulatory Visit: Payer: 59

## 2024-03-06 ENCOUNTER — Encounter: Payer: Self-pay | Admitting: Endocrinology

## 2024-03-06 LAB — T4, FREE: Free T4: 1.2 ng/dL (ref 0.8–1.8)

## 2024-03-06 LAB — T3, FREE: T3, Free: 4.4 pg/mL — ABNORMAL HIGH (ref 2.3–4.2)

## 2024-03-06 LAB — TSH: TSH: 0.09 m[IU]/L — ABNORMAL LOW

## 2024-03-12 ENCOUNTER — Ambulatory Visit: Payer: 59 | Admitting: Endocrinology

## 2024-03-12 ENCOUNTER — Encounter: Payer: Self-pay | Admitting: Endocrinology

## 2024-03-12 VITALS — BP 114/80 | HR 87 | Resp 20 | Ht 62.0 in | Wt 171.8 lb

## 2024-03-12 DIAGNOSIS — E7800 Pure hypercholesterolemia, unspecified: Secondary | ICD-10-CM | POA: Diagnosis not present

## 2024-03-12 DIAGNOSIS — E041 Nontoxic single thyroid nodule: Secondary | ICD-10-CM | POA: Diagnosis not present

## 2024-03-12 DIAGNOSIS — E890 Postprocedural hypothyroidism: Secondary | ICD-10-CM | POA: Diagnosis not present

## 2024-03-12 NOTE — Progress Notes (Signed)
 Outpatient Endocrinology Note [place] Alazne Quant, MD  03/12/24  Patient's Name: Paula Mayer    DOB: 1976/11/21    MRN: [id number]  REASON OF VISIT: Follow-up for hypothyroidism  PCP: Patient, No Pcp Per  HISTORY OF PRESENT ILLNESS:   Paula Mayer is a 48 y.o. old female with past medical history as listed below is presented for a follow up of postablative hypothyroidism /thyroid  nodule.   Pertinent Thyroid  History: - HYPOTHYROIDISM was first diagnosed  in 1994, following I-131 treatment for a hot nodules associated with subclinical hyperthyroidism. Initially TSH was 10.1. Initially for a few years she had been on very high doses of thyroid  supplement but her dose was progressively reduced later.  - She was taken off Synthroid  because of negative TPO antibodies  but TSH  increased to 8.8 in 08/2012 and she had more fatigue; she was started back on Synthroid  then. She had been on a stable dose of 50 mcg Synthroid  since about 2011. Since 06/2017 she was taking only 25 g levothyroxine  using brand name Synthroid .   - In 06/2020: she has palpitation, with low TSH 0.1 and mildly elevated FT3, levothyroxine  was stopped. She remained off of levothyroxine  until 03/2023 with normal thyroid  function tests.  - In 03/2023: she had normal TSH and mildly low Ft4, she was symptomatic with fatigue, insomnia, weight gain, started back on levothyroxine  25 mcg daily.  - In July 2024: TSH was0.03, mildly elevated Ft3, levothyroxine  was decreased to 12.5mcg daily.  She does prefer the brand name medication, she thinks she feels jittery with generics.  # THYROID  nodule: She has had likely 2 previous needle aspirations for her partially cystic thyroid  NODULE several years ago, with benign results.    The ultrasound report from 2001 showed a mixed cystic/solid nodule on the right side measuring 3.9 cm in size Also left lobe is hypoplastic  # She has had mild longstanding HYPERCHOLESTEROLEMIA      Interval history   Patient has been taking Synthroid  12.5 mcg daily.  She denies palpitation however sometimes they get hot flashes and disturbed sleep.  She has fatigue.  She also feels like she is having perimenopausal symptoms.  Recent thyroid  function test with low TSH, elevated free T3 and normal free T4 4.  She has right thyroid  nodule, denies any changes or was increased in size.  No neck compressive symptoms.  No other complaints today.   Latest Reference Range & Units 03/06/24 07:58  TSH mIU/L 0.09 (L)  Triiodothyronine,Free,Serum 2.3 - 4.2 pg/mL 4.4 (H)  T4,Free(Direct) 0.8 - 1.8 ng/dL 1.2  (L): Data is abnormally low   REVIEW OF SYSTEMS:  As per history of present illness.   PAST MEDICAL HISTORY: Past Medical History:  Diagnosis Date   H/O gestational diabetes mellitus, not currently pregnant    Hypothyroidism    PONV (postoperative nausea and vomiting)     PAST SURGICAL HISTORY: Past Surgical History:  Procedure Laterality Date   AUGMENTATION MAMMAPLASTY Bilateral    BREAST BIOPSY Left 2016   BREAST ENHANCEMENT SURGERY  2015   LAPAROSCOPIC BILATERAL SALPINGECTOMY Bilateral 07/14/2018   Procedure: LAPAROSCOPIC BILATERAL SALPINGECTOMY;  Surgeon: Cyd Dowse, MD;  Location: [facility] ;  Service: Gynecology;  Laterality: Bilateral;   TONSILLECTOMY     WISDOM TOOTH EXTRACTION      ALLERGIES: No Known Allergies  FAMILY HISTORY:  Family History  Problem Relation Age of Onset   Diabetes Paternal Grandmother    CAD Father  Cancer Father     SOCIAL HISTORY: Social History   Socioeconomic History   Marital status: Divorced    Spouse name: Not on file   Number of children: Not on file   Years of education: Not on file   Highest education level: Not on file  Occupational History   Not on file  Tobacco Use   Smoking status: Former    Types: Cigarettes   Smokeless tobacco: Never  Vaping Use   Vaping status: Every Day  Substance and  Sexual Activity   Alcohol use: Not Currently    Comment: socially   Drug use: No   Sexual activity: Not on file  Other Topics Concern   Not on file  Social History Narrative   Not on file   Social Drivers of Health   Financial Resource Strain: Not on file  Food Insecurity: Not on file  Transportation Needs: Not on file  Physical Activity: Not on file  Stress: Not on file  Social Connections: Not on file    MEDICATIONS:  Current Outpatient Medications  Medication Sig Dispense Refill   ibuprofen (ADVIL,MOTRIN) 200 MG tablet Take 200 mg by mouth every 6 (six) hours as needed.      levocetirizine (XYZAL ) 5 MG tablet Take 1 tablet (5 mg total) by mouth every evening. (Patient taking differently: Take 5 mg by mouth every evening. Patient is taking as needed) 90 tablet 0   MULTIPLE VITAMIN PO Take 1 tablet by mouth daily.     Naproxen Sodium 220 MG CAPS Take by mouth as needed.     Probiotic Product (PROBIOTIC-10) CAPS Take 1 capsule by mouth daily.     pseudoephedrine  (SUDAFED) 60 MG tablet Take 1 tablet (60 mg total) by mouth every 8 (eight) hours as needed for congestion. 30 tablet 0   No current facility-administered medications for this visit.    PHYSICAL EXAM: Vitals:   03/12/24 0807  BP: 114/80  Pulse: 87  Resp: 20  SpO2: 98%  Weight: 171 lb 12.8 oz (77.9 kg)  Height: 5\' 2"  (1.575 m)   Body mass index is 31.42 kg/m.  Wt Readings from Last 3 Encounters:  03/12/24 171 lb 12.8 oz (77.9 kg)  02/14/24 170 lb (77.1 kg)  02/12/24 170 lb (77.1 kg)     General: Well developed, well nourished female in no apparent distress.  HEENT: AT/[place], no external lesions. Hearing intact to the spoken word Eyes:  Conjunctiva clear and no icterus. Neck: Trachea midline, neck supple , palpable, right isthmus soft nodule ~ 3 cm, mobile, non tender. Lungs: Clear to auscultation, no wheeze. Respirations not labored Heart: S1S2, Regular in rate and rhythm. No loud murmurs Abdomen: Soft, non  tender, non distended Neurologic: Alert, oriented, normal speech, deep tendon biceps reflexes normal,  no gross focal neurological deficit Extremities: No pedal pitting edema, no tremors of outstretched hands Skin: Warm, color good.  Psychiatric: Does not appear depressed or anxious  PERTINENT HISTORIC LABORATORY AND IMAGING STUDIES:  All pertinent laboratory results were reviewed. Please see HPI also for further details.   TSH  Date Value Ref Range Status  03/06/2024 0.09 (L) mIU/L Final    Comment:              Reference Range .           > or = 20 Years  0.40-4.50 .                Pregnancy Ranges  First trimester    0.26-2.66           Second trimester   0.55-2.73           Third trimester    0.43-2.91   09/02/2023 0.17 (L) 0.35 - 5.50 uIU/mL Final  06/06/2023 0.03 (L) 0.35 - 5.50 uIU/mL Final     ASSESSMENT / PLAN  1. Postablative hypothyroidism   2. Thyroid  nodule   3. Hypercholesteremia    - Patient has postablative hypothyroidism, treated with RAI ablation in 1990s, levothyroxine  / synthroid  dose was been adjusted multiple times in the past, was off of levothyroxine  as well in the past, levothyroxine  was restarted in 03/2023 at 25mcg daily and decreased to 12.5 mcg in 05/2023.  - She is currently taking levothyroxine  12.5 mcg daily.  Recent lab results with iatrogenic hyperthyroidism, low TSH with elevated free T3 and normal free T4.  Plan: - Stop synthroid  altogether. - Thyroid  function test in 3 months.  # Thyroid  nodule : cystic nodule / isthmus, prir FNA cytology benign. - Last US  in 2001. - Will will check ultrasound thyroid  to monitor thyroid  nodule.  # Hypercholesteremia : - Last lipid level in 05/2023 acceptable. Advised for diet control. - Will check lipid panel in next set of lab.  Diagnoses and all orders for this visit:  Postablative hypothyroidism -     T4, free -     T3, free -     TSH  Thyroid  nodule -     US  THYROID ;  Future  Hypercholesteremia -     Lipid panel     DISPOSITION Follow up in clinic in 3 months suggested.  All questions answered and patient verbalized understanding of the plan.  [place] Janelli Welling, MD [facility] Endocrinology [facility] [address] [place], [address] [place], [place] [address] Phone # [phone]  At least part of this note was generated using voice recognition software. Inadvertent word errors may have occurred, which were not recognized during the proofreading process.

## 2024-03-13 ENCOUNTER — Ambulatory Visit
Admission: RE | Admit: 2024-03-13 | Discharge: 2024-03-13 | Disposition: A | Discharge status: Home / Self Care | Source: Ambulatory Visit | Attending: Endocrinology | Admitting: Endocrinology

## 2024-03-13 DIAGNOSIS — E041 Nontoxic single thyroid nodule: Secondary | ICD-10-CM

## 2024-03-19 ENCOUNTER — Other Ambulatory Visit: Payer: Self-pay | Admitting: Endocrinology

## 2024-03-19 ENCOUNTER — Telehealth: Payer: Self-pay

## 2024-03-19 ENCOUNTER — Encounter: Payer: Self-pay | Admitting: Endocrinology

## 2024-03-19 DIAGNOSIS — E042 Nontoxic multinodular goiter: Secondary | ICD-10-CM

## 2024-03-19 NOTE — Telephone Encounter (Signed)
-----   Message from [place] Thapa sent at 03/19/2024  4:28 PM EDT ----- Please notify patient of ultrasound thyroid  reviewed she has 2 right thyroid  nodules.  Right mid thyroid  nodule measuring 4.7 cm solid and right inferior thyroid  nodule 3.3 cm, both with cystic degeneration.  These nodules may have been biopsied in the past but no records available, may have increased in size over time.  These nodules have no concerning and worrisome features.  I would like to biopsy/FNA of these nodules due to increase in size.  Refer to interventional radiology for FNA of 2 right thyroid  nodules.

## 2024-03-19 NOTE — Telephone Encounter (Signed)
 Patient given results  as directed by MD. No further questions at this time.

## 2024-04-20 ENCOUNTER — Ambulatory Visit
Admission: RE | Admit: 2024-04-20 | Discharge: 2024-04-20 | Disposition: A | Discharge status: Home / Self Care | Source: Ambulatory Visit | Attending: Endocrinology | Admitting: Endocrinology

## 2024-04-20 ENCOUNTER — Other Ambulatory Visit (HOSPITAL_COMMUNITY)
Admission: RE | Admit: 2024-04-20 | Discharge: 2024-04-20 | Disposition: A | Discharge status: Home / Self Care | Source: Ambulatory Visit | Attending: Student | Admitting: Student

## 2024-04-20 DIAGNOSIS — E042 Nontoxic multinodular goiter: Secondary | ICD-10-CM

## 2024-04-20 DIAGNOSIS — E041 Nontoxic single thyroid nodule: Secondary | ICD-10-CM

## 2024-04-23 ENCOUNTER — Ambulatory Visit: Payer: Self-pay | Admitting: Endocrinology

## 2024-04-24 LAB — CYTOLOGY - NON PAP

## 2024-06-25 ENCOUNTER — Other Ambulatory Visit

## 2024-06-26 LAB — LIPID PANEL
Cholesterol: 194 mg/dL (ref <200)
HDL: 62 mg/dL (ref >=50)
LDL Cholesterol (Calc): 107 mg/dL — ABNORMAL HIGH
Non-HDL Cholesterol (Calc): 132 mg/dL — ABNORMAL HIGH (ref <130)
Total CHOL/HDL Ratio: 3.1 (calc) (ref <5.0)
Triglycerides: 142 mg/dL (ref <150)

## 2024-06-26 LAB — T4, FREE: Free T4: 0.8 ng/dL (ref 0.8–1.8)

## 2024-06-26 LAB — T3, FREE: T3, Free: 3.7 pg/mL (ref 2.3–4.2)

## 2024-06-26 LAB — TSH: TSH: 1.77 m[IU]/L

## 2024-06-27 ENCOUNTER — Ambulatory Visit: Payer: Self-pay | Admitting: Endocrinology

## 2024-06-28 ENCOUNTER — Ambulatory Visit: Admitting: Endocrinology

## 2024-06-28 ENCOUNTER — Encounter: Payer: Self-pay | Admitting: Endocrinology

## 2024-06-28 VITALS — BP 96/60 | HR 78 | Resp 20 | Ht 62.0 in | Wt 177.8 lb

## 2024-06-28 DIAGNOSIS — E7800 Pure hypercholesterolemia, unspecified: Secondary | ICD-10-CM | POA: Diagnosis not present

## 2024-06-28 DIAGNOSIS — E890 Postprocedural hypothyroidism: Secondary | ICD-10-CM

## 2024-06-28 DIAGNOSIS — E042 Nontoxic multinodular goiter: Secondary | ICD-10-CM | POA: Diagnosis not present

## 2024-06-28 NOTE — Progress Notes (Signed)
 Outpatient Endocrinology Note [place] Tarquin Welcher, MD  06/28/24  Patient's Name: Paula Mayer    DOB: May 07, 1976    MRN: [id number]  REASON OF VISIT: Follow-up for hypothyroidism / thyroid  nodules  PCP: Patient, No Pcp Per  HISTORY OF PRESENT ILLNESS:   Paula Mayer is a 48 y.o. old female with past medical history as listed below is presented for a follow up of postablative hypothyroidism  / thyroid  nodules.     Pertinent Thyroid  History: - HYPOTHYROIDISM was first diagnosed  in 1994, following I-131 treatment for a hot nodules associated with subclinical hyperthyroidism. Initially TSH was 10.1. Initially for a few years she had been on very high doses of thyroid  supplement but her dose was progressively reduced later.  - She was taken off Synthroid  because of negative TPO antibodies  but TSH  increased to 8.8 in 08/2012 and she had more fatigue; she was started back on Synthroid  then. She had been on a stable dose of 50 mcg Synthroid  since about 2011. Since 06/2017 she was taking only 25 g levothyroxine  using brand name Synthroid .   - In 06/2020: she has palpitation, with low TSH 0.1 and mildly elevated FT3, levothyroxine  was stopped. She remained off of levothyroxine  until 03/2023 with normal thyroid  function tests.  - In 03/2023: she had normal TSH and mildly low Ft4, she was symptomatic with fatigue, insomnia, weight gain, started back on levothyroxine  25 mcg daily.  - In July 2024: TSH was 0.03, mildly elevated Ft3, levothyroxine  was decreased to 12.5mcg daily.  She does prefer the brand name medication, she thinks she feels jittery with generics.  -Levothyroxine  was stopped in April 2025 when TSH low 0.09 and mildly elevated free T3 of 4.4 and normal free T4 1.2.  # THYROID  nodules:  - She has had likely 2 previous needle aspirations for her partially cystic thyroid  NODULE several years ago, with benign results.    The ultrasound report from 2001 showed a mixed  cystic/solid nodule on the right side measuring 3.9 cm in size Also left lobe is hypoplastic.  US  thyroid  in April 2025 showed 2 right thyroid  nodules.  Right mid thyroid  nodule measuring 4.7 cm solid and right inferior thyroid  nodule 3.3 cm, both with cystic degeneration.  These nodules have no concerning and worrisome features.    Due to increase in size sent to IR for FNA, completed on Apr 20, 2024 : Cytology scant follicular epithelium present Bethesda category 1 nondiagnostic , satisfactory but limited for evaluation scanty cellularity of both nodules.  # She has had mild longstanding HYPERCHOLESTEROLEMIA     Interval history  Patient is no longer taking Synthroid /levothyroxine , was stopped in last few days in April.  She continued to have fatigue however she did not feel any new symptoms after not being on levothyroxine .  She has normal thyroid  function test as follows.  No palpitation and heat intolerance.  She is also experiencing possible perimenopausal symptoms.  She has 2 prior thyroid  nodules, had FNA in May however cytology nondiagnostic due to scanty cellularity.  Patient has not noticed any neck compressive symptoms.  She has not noticed any enlarging right thyroid  nodule.  Ultrasound thyroid  images and cytology reports reviewed in the clinic today with patient.  No other complaints today.  Patient laboratory results reviewed with LDL of 107.    Latest Reference Range & Units 06/25/24 08:02  TSH mIU/L 1.77  Triiodothyronine,Free,Serum 2.3 - 4.2 pg/mL 3.7  T4,Free(Direct) 0.8 - 1.8 ng/dL 0.8  REVIEW OF SYSTEMS:  As per history of present illness.   PAST MEDICAL HISTORY: Past Medical History:  Diagnosis Date   H/O gestational diabetes mellitus, not currently pregnant    Hypothyroidism    PONV (postoperative nausea and vomiting)     PAST SURGICAL HISTORY: Past Surgical History:  Procedure Laterality Date   AUGMENTATION MAMMAPLASTY Bilateral    BREAST BIOPSY  Left 2016   BREAST ENHANCEMENT SURGERY  2015   LAPAROSCOPIC BILATERAL SALPINGECTOMY Bilateral 07/14/2018   Procedure: LAPAROSCOPIC BILATERAL SALPINGECTOMY;  Surgeon: Horacio Boas, MD;  Location: [facility] [place];  Service: Gynecology;  Laterality: Bilateral;   TONSILLECTOMY     WISDOM TOOTH EXTRACTION      ALLERGIES: No Known Allergies  FAMILY HISTORY:  Family History  Problem Relation Age of Onset   Diabetes Paternal Grandmother    CAD Father    Cancer Father     SOCIAL HISTORY: Social History   Socioeconomic History   Marital status: Divorced    Spouse name: Not on file   Number of children: Not on file   Years of education: Not on file   Highest education level: Not on file  Occupational History   Not on file  Tobacco Use   Smoking status: Former    Types: Cigarettes   Smokeless tobacco: Never  Vaping Use   Vaping status: Every Day  Substance and Sexual Activity   Alcohol use: Not Currently    Comment: socially   Drug use: No   Sexual activity: Not on file  Other Topics Concern   Not on file  Social History Narrative   Not on file   Social Drivers of Health   Financial Resource Strain: Not on file  Food Insecurity: Not on file  Transportation Needs: Not on file  Physical Activity: Not on file  Stress: Not on file  Social Connections: Not on file    MEDICATIONS:  Current Outpatient Medications  Medication Sig Dispense Refill   ibuprofen (ADVIL,MOTRIN) 200 MG tablet Take 200 mg by mouth every 6 (six) hours as needed.      levocetirizine (XYZAL ) 5 MG tablet Take 1 tablet (5 mg total) by mouth every evening. (Patient taking differently: Take 5 mg by mouth as needed. Patient is taking as needed) 90 tablet 0   MULTIPLE VITAMIN PO Take 1 tablet by mouth daily.     Naproxen Sodium 220 MG CAPS Take by mouth as needed.     pseudoephedrine  (SUDAFED) 60 MG tablet Take 1 tablet (60 mg total) by mouth every 8 (eight) hours as needed for congestion. 30  tablet 0   Probiotic Product (PROBIOTIC-10) CAPS Take 1 capsule by mouth daily. (Patient not taking: Reported on 06/28/2024)     No current facility-administered medications for this visit.    PHYSICAL EXAM: Vitals:   06/28/24 0843  BP: 96/60  Pulse: 78  Resp: 20  SpO2: 99%  Weight: 177 lb 12.8 oz (80.6 kg)  Height: 5' 2 (1.575 m)    Body mass index is 32.52 kg/m.  Wt Readings from Last 3 Encounters:  06/28/24 177 lb 12.8 oz (80.6 kg)  03/12/24 171 lb 12.8 oz (77.9 kg)  02/14/24 170 lb (77.1 kg)     General: Well developed, well nourished female in no apparent distress.  HEENT: AT/[place], no external lesions. Hearing intact to the spoken word Eyes:  Conjunctiva clear and no icterus. Neck: Trachea midline, neck supple , palpable, right isthmus soft nodule ~ 3 - 4 cm,  mobile, non tender. Lungs: Clear to auscultation, no wheeze. Respirations not labored Heart: S1S2, Regular in rate and rhythm.  Abdomen: Soft, non tender, non distended Neurologic: Alert, oriented, normal speech, deep tendon biceps reflexes normal,  no gross focal neurological deficit Extremities: No pedal pitting edema, no tremors of outstretched hands Skin: Warm, color good.  Psychiatric: Does not appear depressed or anxious  PERTINENT HISTORIC LABORATORY AND IMAGING STUDIES:  All pertinent laboratory results were reviewed. Please see HPI also for further details.   TSH  Date Value Ref Range Status  06/25/2024 1.77 mIU/L Final    Comment:              Reference Range .           > or = 20 Years  0.40-4.50 .                Pregnancy Ranges           First trimester    0.26-2.66           Second trimester   0.55-2.73           Third trimester    0.43-2.91   03/06/2024 0.09 (L) mIU/L Final    Comment:              Reference Range .           > or = 20 Years  0.40-4.50 .                Pregnancy Ranges           First trimester    0.26-2.66           Second trimester   0.55-2.73           Third  trimester    0.43-2.91   09/02/2023 0.17 (L) 0.35 - 5.50 uIU/mL Final    CLINICAL DATA:  Goiter. History of prior radioactive iodine ablation therapy.   EXAM: On March 13, 2024   THYROID  ULTRASOUND   TECHNIQUE: Ultrasound examination of the thyroid  gland and adjacent soft tissues was performed.   COMPARISON:  None Available.   FINDINGS: Parenchymal Echotexture: Markedly heterogenous   Isthmus: 0.2 cm   Right lobe: 6.5 x 4.0 x 3.1 cm   Left lobe: 1.8 x 0.7 x 0.5 cm   _________________________________________________________   Estimated total number of nodules >/= 1 cm: 2   Number of spongiform nodules >/=  2 cm not described below (TR1): 0   Number of mixed cystic and solid nodules >/= 1.5 cm not described below (TR2): 0   _________________________________________________________   Nodule # 1: Predominantly solid isoechoic nodule in the right mid gland measures approximately 4.7 x 3.6 x 2.1 cm. No calcifications or suspicious features. Findings are consistent with TI-RADS category 3. **Given size (>/= 2.5 cm) and appearance, fine needle aspiration of this mildly suspicious nodule should be considered based on TI-RADS criteria.   Nodule # 2: Predominantly solid isoechoic nodule just deep to nodule # 1 above measures grossly 3.3 x 3.0 x 2.7 cm. Findings are also consistent with TI-RADS category 3. **Given size (>/= 2.5 cm) and appearance, fine needle aspiration of this mildly suspicious nodule should be considered based on TI-RADS criteria.   IMPRESSION: 1. Overall, the background thyroid  gland is markedly heterogeneous and atrophied consistent with the clinical history of prior radioactive iodine ablation. 2. There are 2 dominant TI-RADS category 3 nodules in the right mid gland (labeled # 1 and #  2 above). Both meet criteria to consider fine-needle aspiration biopsy if not previously sampled.  FINAL MICROSCOPIC DIAGNOSIS: Right inferior thyroid  nodule  measuring 3.3 cm on 04/20/2024. - Scant follicular epithelium present (Bethesda category I)  - Nondiagnostic material   FINAL MICROSCOPIC DIAGNOSIS: Right mid gland nodule measuring 4.7 cm on Apr 20, 2024. - Scant follicular epithelium present (Bethesda category I)  - Nondiagnostic material   ASSESSMENT / PLAN  1. Multiple thyroid  nodules   2. Postablative hypothyroidism   3. Hypercholesteremia    - Patient has postablative hypothyroidism, treated with RAI ablation in 1990s, levothyroxine  / synthroid  dose was been adjusted multiple times in the past, was off of levothyroxine  as well in the past, levothyroxine  was restarted in 03/2023 at 25 mcg daily and decreased to 12.5 mcg in 05/2023.  Levothyroxine  was stopped in April 2025 when TSH was low and elevated free T4 3. - She has normal thyroid  function test off of levothyroxine .  Plan: - No need of thyroid  medication at this time.  Will continue to monitor thyroid  function test.  # Thyroid  nodules : cystic nodule / isthmus, prir FNA cytology benign. - Last US  in 2001. -Ultrasound thyroid  in April 2025 showed 2 right thyroid  nodules measuring 4.7 cm and 3.3 cm solid with central cystic degeneration, status post FNA in May 2025 with nondiagnostic cytology due to scanty cellularity.  These nodules have no worrisome features.  Discussed about repeat FNA vs serial ultrasound monitoring.  Patient is okay / preferred with monitoring with serial ultrasound. -Check ultrasound thyroid  in 3 months to monitor thyroid  nodules. -Patient is asked to call our clinic if she notices enlarging thyroid  nodule or any new neck compressive symptoms.  # Hypercholesteremia : -Recent lipids level acceptable with LDL of 107.  Total cholesterol of 194.  Triglyceride 142. -Advised for diet control.  No plan for statin therapy at this time.  Diagnoses and all orders for this visit:  Multiple thyroid  nodules -     US  THYROID ; Future  Postablative  hypothyroidism  Hypercholesteremia    DISPOSITION Follow up in clinic in 3 - 4 months suggested, after planned ultrasound thyroid  in 3 months.  All questions answered and patient verbalized understanding of the plan.  [place] Khamya Topp, MD [facility] Endocrinology [facility] [address] [place], [address] [place], [place] [address] Phone # [phone]  At least part of this note was generated using voice recognition software. Inadvertent word errors may have occurred, which were not recognized during the proofreading process.

## 2024-11-09 ENCOUNTER — Other Ambulatory Visit

## 2024-11-09 DIAGNOSIS — E042 Nontoxic multinodular goiter: Secondary | ICD-10-CM

## 2024-11-12 ENCOUNTER — Ambulatory Visit: Payer: Self-pay | Admitting: Endocrinology

## 2025-03-01 ENCOUNTER — Ambulatory Visit
